# Patient Record
Sex: Male | Born: 1995 | Race: White | Hispanic: No | Marital: Single | Smoking: Never smoker
Health system: Southern US, Community
[De-identification: ages and names within clinical notes are randomized; demographics above are authoritative.]

## PROBLEM LIST (undated history)

## (undated) DIAGNOSIS — M064 Inflammatory polyarthropathy: Secondary | ICD-10-CM

## (undated) HISTORY — PX: WISDOM TOOTH EXTRACTION: SHX21

## (undated) HISTORY — DX: Inflammatory polyarthropathy: M06.4

## (undated) HISTORY — PX: COLONOSCOPY: SHX174

## (undated) HISTORY — PX: OTHER SURGICAL HISTORY: SHX169

---
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
## 2015-08-15 ENCOUNTER — Encounter (HOSPITAL_COMMUNITY): Payer: Self-pay | Admitting: Emergency Medicine

## 2015-08-15 ENCOUNTER — Emergency Department (HOSPITAL_COMMUNITY)
Admission: EM | Admit: 2015-08-15 | Discharge: 2015-08-16 | Disposition: A | Discharge status: Home / Self Care | Payer: 59 | Attending: Emergency Medicine | Admitting: Emergency Medicine

## 2015-08-15 DIAGNOSIS — M25511 Pain in right shoulder: Secondary | ICD-10-CM | POA: Diagnosis not present

## 2015-08-15 DIAGNOSIS — R079 Chest pain, unspecified: Secondary | ICD-10-CM | POA: Insufficient documentation

## 2015-08-15 DIAGNOSIS — M25512 Pain in left shoulder: Secondary | ICD-10-CM | POA: Insufficient documentation

## 2015-08-15 DIAGNOSIS — N390 Urinary tract infection, site not specified: Secondary | ICD-10-CM | POA: Insufficient documentation

## 2015-08-15 DIAGNOSIS — R509 Fever, unspecified: Secondary | ICD-10-CM | POA: Diagnosis present

## 2015-08-15 DIAGNOSIS — R05 Cough: Secondary | ICD-10-CM | POA: Insufficient documentation

## 2015-08-15 DIAGNOSIS — R634 Abnormal weight loss: Secondary | ICD-10-CM | POA: Diagnosis not present

## 2015-08-15 NOTE — ED Notes (Signed)
Pt states that he has had a 30lb weight loss in approx. 3 weeks but has a fever x 3 days. Pt also states that he has had aches and pains along with chest pain. Alert and oriented.

## 2015-08-15 NOTE — ED Notes (Signed)
Pt from home with his mother. Pt took motrin earlier for a temperature of 103 at home, which brought it down to 99 here. Pt has c/o generalized pain. Pt states he had similar symptoms about 5 years ago, along with a high temperature and low WBC count and they thought it might have been a tick bite.  Pt also states he has lost 30 lbs over about 3 months.

## 2015-08-16 ENCOUNTER — Emergency Department (HOSPITAL_COMMUNITY): Payer: 59

## 2015-08-16 LAB — COMPREHENSIVE METABOLIC PANEL
ALBUMIN: 3.7 g/dL (ref 3.5–5.0)
ALK PHOS: 78 U/L (ref 38–126)
ALT: 11 U/L — AB (ref 17–63)
AST: 14 U/L — ABNORMAL LOW (ref 15–41)
Anion gap: 6 (ref 5–15)
BUN: 19 mg/dL (ref 6–20)
CALCIUM: 8.8 mg/dL — AB (ref 8.9–10.3)
CO2: 26 mmol/L (ref 22–32)
CREATININE: 0.89 mg/dL (ref 0.61–1.24)
Chloride: 100 mmol/L — ABNORMAL LOW (ref 101–111)
GFR calc Af Amer: >60 mL/min (ref >60)
GFR calc non Af Amer: >60 mL/min (ref >60)
GLUCOSE: 100 mg/dL — AB (ref 65–99)
Potassium: 3.7 mmol/L (ref 3.5–5.1)
SODIUM: 132 mmol/L — AB (ref 135–145)
Total Bilirubin: 0.6 mg/dL (ref 0.3–1.2)
Total Protein: 7.5 g/dL (ref 6.5–8.1)

## 2015-08-16 LAB — URINALYSIS, ROUTINE W REFLEX MICROSCOPIC
BILIRUBIN URINE: NEGATIVE
GLUCOSE, UA: NEGATIVE mg/dL
HGB URINE DIPSTICK: NEGATIVE
KETONES UR: NEGATIVE mg/dL
Nitrite: NEGATIVE
PH: 7 (ref 5.0–8.0)
Protein, ur: NEGATIVE mg/dL
Specific Gravity, Urine: 1.017 (ref 1.005–1.030)

## 2015-08-16 LAB — CBC WITH DIFFERENTIAL/PLATELET
BASOS PCT: 0 %
Basophils Absolute: 0 10*3/uL (ref 0.0–0.1)
EOS ABS: 0 10*3/uL (ref 0.0–0.7)
Eosinophils Relative: 0 %
HEMATOCRIT: 30.5 % — AB (ref 39.0–52.0)
HEMOGLOBIN: 9.9 g/dL — AB (ref 13.0–17.0)
LYMPHS ABS: 1.8 10*3/uL (ref 0.7–4.0)
Lymphocytes Relative: 10 %
MCH: 27 pg (ref 26.0–34.0)
MCHC: 32.5 g/dL (ref 30.0–36.0)
MCV: 83.1 fL (ref 78.0–100.0)
Monocytes Absolute: 1.3 10*3/uL — ABNORMAL HIGH (ref 0.1–1.0)
Monocytes Relative: 7 %
NEUTROS ABS: 15.1 10*3/uL — AB (ref 1.7–7.7)
NEUTROS PCT: 83 %
Platelets: 493 10*3/uL — ABNORMAL HIGH (ref 150–400)
RBC: 3.67 MIL/uL — AB (ref 4.22–5.81)
RDW: 13.2 % (ref 11.5–15.5)
WBC: 18.2 10*3/uL — AB (ref 4.0–10.5)

## 2015-08-16 LAB — URINE MICROSCOPIC-ADD ON
BACTERIA UA: NONE SEEN
RBC / HPF: NONE SEEN RBC/hpf (ref 0–5)

## 2015-08-16 LAB — HIV ANTIBODY (ROUTINE TESTING W REFLEX): HIV Screen 4th Generation wRfx: NONREACTIVE

## 2015-08-16 MED ORDER — ACETAMINOPHEN 325 MG PO TABS
ORAL_TABLET | ORAL | Status: AC
  Filled 2015-08-16: qty 2

## 2015-08-16 MED ORDER — DOXYCYCLINE HYCLATE 100 MG PO CAPS: 100.0000 mg | ORAL_CAPSULE | Freq: Two times a day (BID) | ORAL | Status: DC

## 2015-08-16 MED ORDER — ACETAMINOPHEN 325 MG PO TABS
650.0000 mg | ORAL_TABLET | Freq: Once | ORAL | Status: AC
  Administered 2015-08-16: 650 mg via ORAL

## 2015-08-16 MED ORDER — CEPHALEXIN 500 MG PO CAPS: 500.0000 mg | ORAL_CAPSULE | Freq: Four times a day (QID) | ORAL | Status: DC

## 2015-08-16 MED ORDER — SODIUM CHLORIDE 0.9 % IV BOLUS (SEPSIS)
1000.0000 mL | Freq: Once | INTRAVENOUS | Status: AC
  Administered 2015-08-16: 1000 mL via INTRAVENOUS

## 2015-08-16 MED ORDER — DEXTROSE 5 % IV SOLN
INTRAVENOUS | Status: AC
  Filled 2015-08-16: qty 10

## 2015-08-16 MED ORDER — DEXTROSE 5 % IV SOLN
1.0000 g | Freq: Once | INTRAVENOUS | Status: AC
  Administered 2015-08-16: 1 g via INTRAVENOUS

## 2015-08-16 NOTE — ED Notes (Signed)
Pt is aware that urine is needed for sample, urinal at bedside. 

## 2015-08-16 NOTE — ED Provider Notes (Addendum)
CSN: [id number]     Arrival date & time 08/15/15  2312 History   First MD Initiated Contact with Patient 08/15/15 2341     Chief Complaint  Patient presents with  . Fever  . Weight Loss     (Consider location/radiation/quality/duration/timing/severity/associated sxs/prior Treatment) Patient is a 19 y.o. male presenting with fever. The history is provided by the patient. No language interpreter was used.  Fever Max temp prior to arrival:  103 Temp source:  Subjective and oral Severity:  Severe Onset quality:  Gradual Duration:  3 days Timing:  Constant Progression:  Worsening Chronicity:  New Relieved by:  Nothing Worsened by:  Nothing tried Ineffective treatments:  None tried Associated symptoms: cough   Associated symptoms: no diarrhea, no headaches, no nausea and no sore throat   Risk factors: no hx of cancer   Pt reports he has lost 30 pounds in the last 3 months.  Pt works as a Engineer, building services.  Pt does hard work but does not believe it is the cause of his weight loss. Mother is concerned about Rheumatoid arthritis because pt has bilat shoulder pain and joint pain. Pt complains of pain in his chest with a deep breath.  Pt denies std risk.  Pt had some difficulty urinating a few days ago. Pt reports his stream was split and he felt like he passed something and stream returned to normal. Pt denies HIv risk.  No drugs no alcohol.   History reviewed. No pertinent past medical history. Past Surgical History  Procedure Laterality Date  . Wisdom tooth extraction     No family history on file. Social History  Substance Use Topics  . Smoking status: Never Smoker   . Smokeless tobacco: None  . Alcohol Use: No    Review of Systems  Constitutional: Positive for fever.  HENT: Negative for sore throat.   Respiratory: Positive for cough.   Gastrointestinal: Negative for nausea and diarrhea.  Neurological: Negative for headaches.  All other systems reviewed and are  negative.     Allergies  Review of patient's allergies indicates no known allergies.  Home Medications   Prior to Admission medications   Medication Sig Start Date End Date Taking? Authorizing Provider  ibuprofen (ADVIL,MOTRIN) 200 MG tablet Take 400 mg by mouth every 6 (six) hours as needed for fever.   Yes Historical Provider, MD  Ibuprofen-Diphenhydramine HCl (ADVIL PM) 200-25 MG CAPS Take 1 tablet by mouth at bedtime as needed (sleep).   Yes Historical Provider, MD   BP 117/59 mmHg  Pulse 120  Temp(Src) 100.4 F (38 C) (Oral)  Resp 22  Wt 150 lb (68.04 kg)  SpO2 100% Physical Exam  Constitutional: He is oriented to person, place, and time. He appears well-developed and well-nourished.  HENT:  Head: Normocephalic.  Right Ear: External ear normal.  Left Ear: External ear normal.  Nose: Nose normal.  Mouth/Throat: Oropharynx is clear and moist.  Eyes: Conjunctivae and EOM are normal. Pupils are equal, round, and reactive to light.  Neck: Normal range of motion.  Cardiovascular: Normal rate and normal heart sounds.   Pulmonary/Chest: Effort normal.  Abdominal: Soft. He exhibits no distension.  Musculoskeletal: Normal range of motion.  Neurological: He is alert and oriented to person, place, and time.  Skin: Skin is warm.  Psychiatric: He has a normal mood and affect.  Nursing note and vitals reviewed.   ED Course  Procedures (including critical care time) Labs Review Labs Reviewed  CBC WITH DIFFERENTIAL/PLATELET  COMPREHENSIVE METABOLIC PANEL  URINALYSIS, ROUTINE W REFLEX MICROSCOPIC (NOT AT [facility])  HIV ANTIBODY (ROUTINE TESTING)    Imaging Review No results found. I have personally reviewed and evaluated these images and lab results as part of my medical decision-making.   EKG Interpretation None      MDM Iv fluids ordered, tylenol po.Pt's care turned over to Dr. Waverly Ferrari with labs pending at 1:00am.    Final diagnoses:  None        Fransico Meadow,  PA-C 08/16/15 [place], MD 08/16/15 [place], PA-C 08/16/15 [place], MD 08/16/15 [phone]

## 2015-08-17 ENCOUNTER — Ambulatory Visit (INDEPENDENT_AMBULATORY_CARE_PROVIDER_SITE_OTHER): Payer: PRIVATE HEALTH INSURANCE | Admitting: Osteopathic Medicine

## 2015-08-17 ENCOUNTER — Encounter: Payer: Self-pay | Admitting: Osteopathic Medicine

## 2015-08-17 VITALS — BP 129/67 | HR 98 | Ht 77.0 in | Wt 154.0 lb

## 2015-08-17 DIAGNOSIS — R109 Unspecified abdominal pain: Secondary | ICD-10-CM

## 2015-08-17 DIAGNOSIS — R634 Abnormal weight loss: Secondary | ICD-10-CM | POA: Diagnosis not present

## 2015-08-17 DIAGNOSIS — N390 Urinary tract infection, site not specified: Secondary | ICD-10-CM | POA: Diagnosis not present

## 2015-08-17 DIAGNOSIS — D72829 Elevated white blood cell count, unspecified: Secondary | ICD-10-CM

## 2015-08-17 DIAGNOSIS — A689 Relapsing fever, unspecified: Secondary | ICD-10-CM | POA: Diagnosis not present

## 2015-08-17 DIAGNOSIS — M2550 Pain in unspecified joint: Secondary | ICD-10-CM

## 2015-08-17 DIAGNOSIS — D649 Anemia, unspecified: Secondary | ICD-10-CM

## 2015-08-17 LAB — FERRITIN: Ferritin: 972 ng/mL — ABNORMAL HIGH (ref 22–322)

## 2015-08-17 LAB — COMPLETE METABOLIC PANEL WITH GFR
ALBUMIN: 3.9 g/dL (ref 3.6–5.1)
ALK PHOS: 93 U/L (ref 40–115)
ALT: 8 U/L — AB (ref 9–46)
AST: 14 U/L (ref 10–40)
BILIRUBIN TOTAL: 0.5 mg/dL (ref 0.2–1.2)
BUN: 11 mg/dL (ref 7–25)
CALCIUM: 9.5 mg/dL (ref 8.6–10.3)
CO2: 26 mmol/L (ref 20–31)
CREATININE: 0.64 mg/dL (ref 0.60–1.35)
Chloride: 98 mmol/L (ref 98–110)
GFR, Est African American: >89 mL/min (ref >=60)
GFR, Est Non African American: >89 mL/min (ref >=60)
Glucose, Bld: 61 mg/dL — ABNORMAL LOW (ref 65–99)
Potassium: 4 mmol/L (ref 3.5–5.3)
Sodium: 137 mmol/L (ref 135–146)
TOTAL PROTEIN: 7.7 g/dL (ref 6.1–8.1)

## 2015-08-17 LAB — POCT URINALYSIS DIPSTICK
BILIRUBIN UA: NEGATIVE
GLUCOSE UA: NEGATIVE
Ketones, UA: NEGATIVE
Leukocytes, UA: NEGATIVE
NITRITE UA: NEGATIVE
RBC UA: NEGATIVE
Spec Grav, UA: >=1.03
Urobilinogen, UA: 0.2
pH, UA: 5.5

## 2015-08-17 LAB — CBC WITH DIFFERENTIAL/PLATELET
BASOS ABS: 0 10*3/uL (ref 0.0–0.1)
Basophils Relative: 0 % (ref 0–1)
EOS PCT: 0 % (ref 0–5)
Eosinophils Absolute: 0 10*3/uL (ref 0.0–0.7)
HEMATOCRIT: 33.3 % — AB (ref 39.0–52.0)
Hemoglobin: 10.8 g/dL — ABNORMAL LOW (ref 13.0–17.0)
LYMPHS ABS: 1.3 10*3/uL (ref 0.7–4.0)
LYMPHS PCT: 7 % — AB (ref 12–46)
MCH: 26.3 pg (ref 26.0–34.0)
MCHC: 32.4 g/dL (ref 30.0–36.0)
MCV: 81 fL (ref 78.0–100.0)
MPV: 9.1 fL (ref 8.6–12.4)
Monocytes Absolute: 1.1 10*3/uL — ABNORMAL HIGH (ref 0.1–1.0)
Monocytes Relative: 6 % (ref 3–12)
Neutro Abs: 15.9 10*3/uL — ABNORMAL HIGH (ref 1.7–7.7)
Neutrophils Relative %: 87 % — ABNORMAL HIGH (ref 43–77)
Platelets: 564 10*3/uL — ABNORMAL HIGH (ref 150–400)
RBC: 4.11 MIL/uL — ABNORMAL LOW (ref 4.22–5.81)
RDW: 13.5 % (ref 11.5–15.5)
WBC: 18.3 10*3/uL — ABNORMAL HIGH (ref 4.0–10.5)

## 2015-08-17 LAB — RETICULOCYTES
ABS Retic: 45.2 10*3/uL (ref 19.0–186.0)
RBC.: 4.11 MIL/uL — ABNORMAL LOW (ref 4.22–5.81)
Retic Ct Pct: 1.1 % (ref 0.4–2.3)

## 2015-08-17 LAB — URINE CULTURE: CULTURE: NO GROWTH

## 2015-08-17 LAB — IRON AND TIBC
%SAT: 5 % — AB (ref 15–60)
IRON: 12 ug/dL — AB (ref 50–195)
TIBC: 255 ug/dL (ref 250–425)
UIBC: 243 ug/dL (ref 125–400)

## 2015-08-17 LAB — TSH: TSH: 1.466 u[IU]/mL (ref 0.350–4.500)

## 2015-08-17 LAB — RHEUMATOID FACTOR: Rhuematoid fact SerPl-aCnc: <10 IU/mL (ref <=14)

## 2015-08-17 LAB — C-REACTIVE PROTEIN: CRP: 10.2 mg/dL — AB (ref <0.60)

## 2015-08-17 MED ORDER — MELOXICAM 7.5 MG PO TABS: 7.5000 mg | ORAL_TABLET | Freq: Two times a day (BID) | ORAL | Status: DC | PRN

## 2015-08-17 NOTE — Progress Notes (Signed)
HPI: Seth Black is a 19 y.o. male who presents to [place]  today for chief complaint of:  Chief Complaint  Patient presents with  . Establish Care  . Hospitalization Follow-up    POSSIBLE UTI  . Fever  . Weight Loss    35 lbs x 2 mos   Accompanied by mother, patient gives permission to speak freely in front of her  Lost 35 lbs in about 2 months. Joint soreness about 3 months. Urinary issues started about 2 weeks ago. Fever at home before ER was 103. Subjective fever on and off over last few weeks/months, but he cannot measure this.   Seen in [facility] ER yesterday, diagnosed with UTI. Got Ceftriaxone IM in ER plus 10 days Doxy from ER - presumably empiric treatment for GC/CT. Keflex qid also given, presumably for coverage for UTI as well. Culture pending.   Before that, last seen at [facility] in [place], again with concerns for malaise, fever, weight. He was told blood work was ok. Before that, seen at [facility] ER and told inflammatory markers high but not sure what tests these were.   No significant PMH or FH. Patient takes no medications.  Labs reviewed as below: concern for anemia and UTI.    Past medical, social and family history reviewed: History reviewed. No pertinent past medical history. Past Surgical History  Procedure Laterality Date  . Wisdom tooth extraction     Social History  Substance Use Topics  . Smoking status: Never Smoker   . Smokeless tobacco: Not on file  . Alcohol Use: No   No family history on file.  Current Outpatient Prescriptions  Medication Sig Dispense Refill  . cephALEXin (KEFLEX) 500 MG capsule Take 1 capsule (500 mg total) by mouth 4 (four) times daily. 40 capsule 0  . doxycycline (VIBRAMYCIN) 100 MG capsule Take 1 capsule (100 mg total) by mouth 2 (two) times daily. 20 capsule 0  . ibuprofen (ADVIL,MOTRIN) 200 MG tablet Take 400 mg by mouth every 6 (six) hours as needed for fever.    .  Ibuprofen-Diphenhydramine HCl (ADVIL PM) 200-25 MG CAPS Take 1 tablet by mouth at bedtime as needed (sleep).     No current facility-administered medications for this visit.   No Known Allergies    Review of Systems: CONSTITUTIONAL:  Yes  fever, no chills, Yes  unintentional weight changes reports weight loss 35 lbs x 2 mos HEAD/EYES/EARS/NOSE/THROAT: No headache, no vision change, no hearing change, No  sore throat CARDIAC: Yes occasional chest pain hurts to breathe, no pressure/palpitations, no orthopnea RESPIRATORY: No  cough, No  shortness of breath/wheeze GASTROINTESTINAL: No nausea, no vomiting, positive vague lower abdominal pain, no blood in stool, no diarrhea, no constipation MUSCULOSKELETAL: (+) myalgia/arthralgia, reports generalized aches and pains, joint pain particularly children GENITOURINARY: No incontinence, No abnormal genital bleeding/discharge, recently treated in the emergency room for UTI, urinary frequency and burning, patient was reporting some suprapubic pain this has been better, overall improved  SKIN: No rash/wounds/concerning lesions HEM/ONC: No easy bruising/bleeding, no abnormal lymph node ENDOCRINE: No polyuria/polydipsia/polyphagia, no heat/cold intolerance  NEUROLOGIC: (+) Generalized weakness, no dizziness, no slurred speech PSYCHIATRIC: No concerns with depression, no concerns with anxiety, no sleep problems    Exam:  BP 129/67 mmHg  Pulse 98  Ht $R'6\' 5"'uj$  (1.956 m)  Wt 154 lb (69.854 kg)  BMI 18.26 kg/m2 Constitutional: VSS, see above. General Appearance: alert, well-developed, well-nourished, NAD Eyes: Normal lids and conjunctive, non-icteric  sclera, PERRLA Ears, Nose, Mouth, Throat: Normal external inspection ears/nares/mouth/lips/gums, TM normal bilaterally, MMM, posterior pharynx No  erythema No  exudate Neck: No masses, trachea midline. No thyroid enlargement/tenderness/mass appreciated. No lymphadenopathy Respiratory: Normal respiratory effort.  no wheeze, no rhonchi, no rales Cardiovascular: S1/S2 normal, no murmur, no rub/gallop auscultated. RRR.  No carotid bruit or JVD. No abdominal aortic bruit.  Pedal pulse II/IV bilaterally DP and PT.  No lower extremity edema. Gastrointestinal: Nontender, no masses. No hepatomegaly, no splenomegaly. No hernia appreciated. Bowel sounds normal. Rectal exam deferred.  Musculoskeletal: Gait normal. No clubbing/cyanosis of digits.  Neurological: No cranial nerve deficit on limited exam. Motor and sensation intact and symmetric Psychiatric: Normal judgment/insight. Normal mood and affect. Oriented x3.    Results for orders placed or performed during the hospital encounter of 08/15/15 (from the past 72 hour(s))  CBC with Differential/Platelet     Status: Abnormal   Collection Time: 08/16/15  1:03 AM  Result Value Ref Range   WBC 18.2 (H) 4.0 - 10.5 K/uL   RBC 3.67 (L) 4.22 - 5.81 MIL/uL   Hemoglobin 9.9 (L) 13.0 - 17.0 g/dL   HCT 30.5 (L) 39.0 - 52.0 %   MCV 83.1 78.0 - 100.0 fL   MCH 27.0 26.0 - 34.0 pg   MCHC 32.5 30.0 - 36.0 g/dL   RDW 13.2 11.5 - 15.5 %   Platelets 493 (H) 150 - 400 K/uL   Neutrophils Relative % 83 %   Neutro Abs 15.1 (H) 1.7 - 7.7 K/uL   Lymphocytes Relative 10 %   Lymphs Abs 1.8 0.7 - 4.0 K/uL   Monocytes Relative 7 %   Monocytes Absolute 1.3 (H) 0.1 - 1.0 K/uL   Eosinophils Relative 0 %   Eosinophils Absolute 0.0 0.0 - 0.7 K/uL   Basophils Relative 0 %   Basophils Absolute 0.0 0.0 - 0.1 K/uL  Comprehensive metabolic panel     Status: Abnormal   Collection Time: 08/16/15  1:03 AM  Result Value Ref Range   Sodium 132 (L) 135 - 145 mmol/L   Potassium 3.7 3.5 - 5.1 mmol/L   Chloride 100 (L) 101 - 111 mmol/L   CO2 26 22 - 32 mmol/L   Glucose, Bld 100 (H) 65 - 99 mg/dL   BUN 19 6 - 20 mg/dL   Creatinine, Ser 0.89 0.61 - 1.24 mg/dL   Calcium 8.8 (L) 8.9 - 10.3 mg/dL   Total Protein 7.5 6.5 - 8.1 g/dL   Albumin 3.7 3.5 - 5.0 g/dL   AST 14 (L) 15 - 41 U/L   ALT  11 (L) 17 - 63 U/L   Alkaline Phosphatase 78 38 - 126 U/L   Total Bilirubin 0.6 0.3 - 1.2 mg/dL   GFR calc non Af Amer >60 >60 mL/min   GFR calc Af Amer >60 >60 mL/min    Comment: (NOTE) The eGFR has been calculated using the CKD EPI equation. This calculation has not been validated in all clinical situations. eGFR's persistently <60 mL/min signify possible Chronic Kidney Disease.    Anion gap 6 5 - 15  HIV antibody     Status: None   Collection Time: 08/16/15  1:03 AM  Result Value Ref Range   HIV Screen 4th Generation wRfx Non Reactive Non Reactive    Comment: (NOTE) Performed At: [facility] [address] [place], [place] [id number] Lindon Romp MD [id number]   Urinalysis, Routine w reflex microscopic (not at [facility])  Status: Abnormal   Collection Time: 08/16/15  1:30 AM  Result Value Ref Range   Color, Urine YELLOW YELLOW   APPearance CLEAR CLEAR   Specific Gravity, Urine 1.017 1.005 - 1.030   pH 7.0 5.0 - 8.0   Glucose, UA NEGATIVE NEGATIVE mg/dL   Hgb urine dipstick NEGATIVE NEGATIVE   Bilirubin Urine NEGATIVE NEGATIVE   Ketones, ur NEGATIVE NEGATIVE mg/dL   Protein, ur NEGATIVE NEGATIVE mg/dL   Nitrite NEGATIVE NEGATIVE   Leukocytes, UA MODERATE (A) NEGATIVE  Urine microscopic-add on     Status: Abnormal   Collection Time: 08/16/15  1:30 AM  Result Value Ref Range   Squamous Epithelial / LPF 0-5 (A) NONE SEEN    Comment: Please note change in reference range.   WBC, UA TOO NUMEROUS TO COUNT 0 - 5 WBC/hpf    Comment: Please note change in reference range.   RBC / HPF NONE SEEN 0 - 5 RBC/hpf    Comment: Please note change in reference range.   Bacteria, UA NONE SEEN NONE SEEN    Comment: Please note change in reference range.   CXR read as normal in ER   ASSESSMENT/PLAN:  Unintentional weight loss - Plan: CBC with Differential/Platelet, TSH, COMPLETE METABOLIC PANEL WITH GFR, C-reactive protein, Antinuclear Antib (ANA), Rheumatoid Factor,  Sedimentation rate, High sensitivity CRP, VITAMIN D 25 Hydroxy (Vit-D Deficiency, Fractures), AFB culture, blood, DG Chest 2 View, TB Skin Test, Pathologist smear review  Abdominal pain, unspecified abdominal location - Plan: POCT Urinalysis Dipstick  Recurrent fever - Plan: AFB culture, blood, DG Chest 2 View, TB Skin Test, Pathologist smear review, AFB culture, blood  Urinary tract infection without hematuria, site unspecified  Anemia, unspecified anemia type - Plan: CBC with Differential/Platelet, Ferritin, Folate RBC, Iron and TIBC, Reticulocytes, Pathologist smear review, CANCELED: Save smear  Joint pain - Plan: meloxicam (MOBIC) 7.5 MG tablet   Leukocytosis: Most likely due to UTI, will follow CBC  Patient and his mother advised that given his symptoms of intermittent fever and significant weight loss over the past few months, many different causes for these symptoms are possible. Basic lab workup as above. Need to look further into cause of significant anemia. Patient denied easy bruising or bleeding, no blood or dark black stool. No history of kidney or other urinary abnormalities which may need to urinary tract infection. Patient denies history of section transmitted infection. Gonorrhea and Chlamydia tests were ordered in the ER however results are not available, not sure if this order was carried out. We'll need to contact the lab to see if this can be added onto his original urine culture results from his initial specimen. Since patient already on antibiotics, wouldn't consider repeat culture reliable at this time. Patient agrees to come back next week for further discussion of results, we'll call sooner if any critical values come back sooner than that, otherwise prefer to discuss all results in detail. Patient and his mother amenable to this plan. All questions answered, patient is mother are instructed to call office with any other questions or concerns, I am on call this weekend.  Patient also has eczema urologist which she was given in the ER, I advised him to call and make an appointment for further workup of consultative UTI in young male, though there is certainly consideration for underlying serious infection or other cause immunocompromise, further workup pending lab results   Return in about 1 week (around 08/24/2015), or sooner if symptoms worsen or fail to  improve, for DISCUSS RESULTS.  Total time spent 45 minutes, greater than 50% of the visit was counseling and coordinating care for diagnosis of weight loss and fever, UTI, anemia, weakness.

## 2015-08-18 ENCOUNTER — Telehealth: Payer: Self-pay | Admitting: Emergency Medicine

## 2015-08-18 DIAGNOSIS — D72829 Elevated white blood cell count, unspecified: Secondary | ICD-10-CM | POA: Insufficient documentation

## 2015-08-18 DIAGNOSIS — D638 Anemia in other chronic diseases classified elsewhere: Secondary | ICD-10-CM | POA: Insufficient documentation

## 2015-08-18 DIAGNOSIS — R634 Abnormal weight loss: Secondary | ICD-10-CM | POA: Insufficient documentation

## 2015-08-18 DIAGNOSIS — A689 Relapsing fever, unspecified: Secondary | ICD-10-CM | POA: Insufficient documentation

## 2015-08-18 DIAGNOSIS — R109 Unspecified abdominal pain: Secondary | ICD-10-CM | POA: Insufficient documentation

## 2015-08-18 DIAGNOSIS — M2550 Pain in unspecified joint: Secondary | ICD-10-CM | POA: Insufficient documentation

## 2015-08-18 DIAGNOSIS — N390 Urinary tract infection, site not specified: Secondary | ICD-10-CM | POA: Insufficient documentation

## 2015-08-18 LAB — VITAMIN D 25 HYDROXY (VIT D DEFICIENCY, FRACTURES): Vit D, 25-Hydroxy: 28 ng/mL — ABNORMAL LOW (ref 30–100)

## 2015-08-18 LAB — HIGH SENSITIVITY CRP: CRP, High Sensitivity: 102 mg/L — ABNORMAL HIGH

## 2015-08-18 LAB — ANA: Anti Nuclear Antibody(ANA): POSITIVE — AB

## 2015-08-18 LAB — FOLATE RBC

## 2015-08-18 LAB — SEDIMENTATION RATE: Sed Rate: 63 mm/hr — ABNORMAL HIGH (ref 0–15)

## 2015-08-18 LAB — ANTI-NUCLEAR AB-TITER (ANA TITER): ANA Titer 1: 1:160 {titer} — ABNORMAL HIGH

## 2015-08-18 LAB — PATHOLOGIST SMEAR REVIEW

## 2015-08-19 ENCOUNTER — Encounter (HOSPITAL_COMMUNITY): Payer: Self-pay | Admitting: *Deleted

## 2015-08-19 ENCOUNTER — Emergency Department (HOSPITAL_COMMUNITY): Payer: 59

## 2015-08-19 ENCOUNTER — Emergency Department (HOSPITAL_COMMUNITY)
Admission: EM | Admit: 2015-08-19 | Discharge: 2015-08-19 | Disposition: A | Discharge status: Home / Self Care | Payer: 59 | Attending: Emergency Medicine | Admitting: Emergency Medicine

## 2015-08-19 DIAGNOSIS — M2550 Pain in unspecified joint: Secondary | ICD-10-CM

## 2015-08-19 DIAGNOSIS — Z792 Long term (current) use of antibiotics: Secondary | ICD-10-CM | POA: Insufficient documentation

## 2015-08-19 DIAGNOSIS — A689 Relapsing fever, unspecified: Secondary | ICD-10-CM

## 2015-08-19 DIAGNOSIS — R509 Fever, unspecified: Secondary | ICD-10-CM | POA: Diagnosis not present

## 2015-08-19 DIAGNOSIS — D72829 Elevated white blood cell count, unspecified: Secondary | ICD-10-CM | POA: Insufficient documentation

## 2015-08-19 DIAGNOSIS — R634 Abnormal weight loss: Secondary | ICD-10-CM | POA: Diagnosis not present

## 2015-08-19 LAB — COMPREHENSIVE METABOLIC PANEL
ALT: 11 U/L — AB (ref 17–63)
ANION GAP: 12 (ref 5–15)
AST: 16 U/L (ref 15–41)
Albumin: 3.7 g/dL (ref 3.5–5.0)
Alkaline Phosphatase: 89 U/L (ref 38–126)
BUN: 12 mg/dL (ref 6–20)
CHLORIDE: 100 mmol/L — AB (ref 101–111)
CO2: 24 mmol/L (ref 22–32)
CREATININE: 0.8 mg/dL (ref 0.61–1.24)
Calcium: 9.4 mg/dL (ref 8.9–10.3)
Glucose, Bld: 96 mg/dL (ref 65–99)
Potassium: 3.4 mmol/L — ABNORMAL LOW (ref 3.5–5.1)
Sodium: 136 mmol/L (ref 135–145)
Total Bilirubin: 0.4 mg/dL (ref 0.3–1.2)
Total Protein: 8.4 g/dL — ABNORMAL HIGH (ref 6.5–8.1)

## 2015-08-19 LAB — URINALYSIS, ROUTINE W REFLEX MICROSCOPIC
Bilirubin Urine: NEGATIVE
GLUCOSE, UA: NEGATIVE mg/dL
HGB URINE DIPSTICK: NEGATIVE
KETONES UR: NEGATIVE mg/dL
Nitrite: NEGATIVE
PH: 6 (ref 5.0–8.0)
Protein, ur: NEGATIVE mg/dL
Specific Gravity, Urine: 1.021 (ref 1.005–1.030)

## 2015-08-19 LAB — CBC WITH DIFFERENTIAL/PLATELET
BASOS PCT: 0 %
Basophils Absolute: 0.1 10*3/uL (ref 0.0–0.1)
EOS ABS: 0.1 10*3/uL (ref 0.0–0.7)
Eosinophils Relative: 1 %
HEMATOCRIT: 32.3 % — AB (ref 39.0–52.0)
HEMOGLOBIN: 10.5 g/dL — AB (ref 13.0–17.0)
LYMPHS ABS: 1.9 10*3/uL (ref 0.7–4.0)
Lymphocytes Relative: 16 %
MCH: 26.9 pg (ref 26.0–34.0)
MCHC: 32.5 g/dL (ref 30.0–36.0)
MCV: 82.6 fL (ref 78.0–100.0)
Monocytes Absolute: 1.3 10*3/uL — ABNORMAL HIGH (ref 0.1–1.0)
Monocytes Relative: 11 %
NEUTROS ABS: 8.6 10*3/uL — AB (ref 1.7–7.7)
NEUTROS PCT: 72 %
Platelets: 567 10*3/uL — ABNORMAL HIGH (ref 150–400)
RBC: 3.91 MIL/uL — AB (ref 4.22–5.81)
RDW: 13.1 % (ref 11.5–15.5)
WBC: 11.9 10*3/uL — AB (ref 4.0–10.5)

## 2015-08-19 LAB — INFLUENZA PANEL BY PCR (TYPE A & B)
H1N1FLUPCR: NOT DETECTED
INFLBPCR: NEGATIVE
Influenza A By PCR: NEGATIVE

## 2015-08-19 LAB — URINE MICROSCOPIC-ADD ON

## 2015-08-19 LAB — I-STAT CG4 LACTIC ACID, ED: LACTIC ACID, VENOUS: 0.89 mmol/L (ref 0.5–2.0)

## 2015-08-19 LAB — RPR: RPR Ser Ql: NONREACTIVE

## 2015-08-19 MED ORDER — SODIUM CHLORIDE 0.9 % IV SOLN
1000.0000 mL | INTRAVENOUS | Status: DC
  Administered 2015-08-19 (×2): 1000 mL via INTRAVENOUS

## 2015-08-19 NOTE — Discharge Instructions (Signed)
1. Medications: usual home medications - including your Keflex and Doxycycline 2. Treatment: rest, drink plenty of fluids, Tylenol or ibuprofen -for fever control 3. Follow Up: Please followup with your primary doctor at your appointment on Wednesday for discussion of your diagnoses and further evaluation after today's visit; if you do not have a primary care doctor use the resource guide provided to find one; Please return to the ER for worsening symptoms or other concerns

## 2015-08-19 NOTE — ED Notes (Signed)
Patient transported to X-ray 

## 2015-08-19 NOTE — ED Provider Notes (Signed)
CSN: [id number]     Arrival date & time 08/19/15  0022 History   First MD Initiated Contact with Patient 08/19/15 0128     Chief Complaint  Patient presents with  . Fever     (Consider location/radiation/quality/duration/timing/severity/associated sxs/prior Treatment) Patient is a 19 y.o. male presenting with fever. The history is provided by the patient, a parent and medical records. No language interpreter was used.  Fever Associated symptoms: no chest pain, no cough, no diarrhea, no dysuria, no headaches, no nausea, no rash and no vomiting      Seth Black is a 19 y.o. male  with no major medical history presents to the Emergency Department complaining of gradual, persistent, fevers onset 3 days ago. Patient's mother is at bedside and reports that in the last 3 months patient has lost approximately 40 pain returns she reports that he has migrating myalgias most prominently in his hips, shoulders and back. Patient denies swelling, erythema or difficulty bending his joints.  Patient reports that 3 days ago he was found to have a fever of 103.84F and presented here to the emergency department. Record review shows that he had pyuria and was treated for UTI with Keflex and doxycycline. Gonorrhea and Chlamydia cultures were sent but have not resulted.  Patient followed up with a primary care provider yesterday and an entire battery of test was sent. Of note patient's CRP, ESR and a in a were positive. His HIV and urine culture were negative.   Blood cultures were obtained and sent yesterday.   Patient reports he is here in the emergency room tonight because he again was febrile at home.   He has taken ibuprofen with moderate relief of this.   Patient reports that earlier in the week he did have dysuria however he denies dysuria at this time. He denies headache, neck pain, chest pain, shortness of breath, abdominal pain, nausea, vomiting, diarrhea, weakness, dizziness, syncope, hematuria.    Ibuprofen seems to make his symptoms better. Significant strain on his joints make his pain worse. Patient denies travel outside the [place], known tick bites, genital lesions.   History reviewed. No pertinent past medical history. Past Surgical History  Procedure Laterality Date  . Wisdom tooth extraction     No family history on file. Social History  Substance Use Topics  . Smoking status: Never Smoker   . Smokeless tobacco: None  . Alcohol Use: No    Review of Systems  Constitutional: Positive for fever. Negative for diaphoresis, appetite change, fatigue and unexpected weight change.  HENT: Negative for mouth sores.   Eyes: Negative for visual disturbance.  Respiratory: Negative for cough, chest tightness, shortness of breath and wheezing.   Cardiovascular: Negative for chest pain.  Gastrointestinal: Negative for nausea, vomiting, abdominal pain, diarrhea and constipation.  Endocrine: Negative for polydipsia, polyphagia and polyuria.  Genitourinary: Negative for dysuria, urgency, frequency and hematuria.  Musculoskeletal: Positive for arthralgias ( diffuse). Negative for back pain and neck stiffness.  Skin: Negative for rash.  Allergic/Immunologic: Negative for immunocompromised state.  Neurological: Negative for syncope, light-headedness and headaches.  Hematological: Does not bruise/bleed easily.  Psychiatric/Behavioral: Negative for sleep disturbance. The patient is not nervous/anxious.       Allergies  Review of patient's allergies indicates no known allergies.  Home Medications   Prior to Admission medications   Medication Sig Start Date End Date Taking? Authorizing Provider  cephALEXin (KEFLEX) 500 MG capsule Take 1 capsule (500 mg total) by mouth 4 (four) times  daily. 08/16/15  Yes Orpah Greek, MD  doxycycline (VIBRAMYCIN) 100 MG capsule Take 1 capsule (100 mg total) by mouth 2 (two) times daily. 08/16/15  Yes Orpah Greek, MD  ibuprofen  (ADVIL,MOTRIN) 200 MG tablet Take 800 mg by mouth every 6 (six) hours as needed for fever.    Yes Historical Provider, MD  Ibuprofen-Diphenhydramine HCl (ADVIL PM) 200-25 MG CAPS Take 1 tablet by mouth at bedtime as needed (sleep).   Yes Historical Provider, MD  meloxicam (MOBIC) 7.5 MG tablet Take 1 tablet (7.5 mg total) by mouth 2 (two) times daily as needed for pain. 08/17/15  Yes Natalie Alexander, DO   BP 119/68 mmHg  Pulse 87  Temp(Src) 100.1 F (37.8 C) (Rectal)  Resp 14  Ht 6' 5"  (1.956 m)  Wt 148 lb (67.132 kg)  BMI 17.55 kg/m2  SpO2 100% Physical Exam  Constitutional: He appears well-developed and well-nourished. No distress.  Awake, alert, nontoxic appearance  HENT:  Head: Normocephalic and atraumatic.  Mouth/Throat: Oropharynx is clear and moist. No oropharyngeal exudate.  Eyes: Conjunctivae are normal. No scleral icterus.  Neck: Normal range of motion. Neck supple.  Cardiovascular: Normal rate, regular rhythm, normal heart sounds and intact distal pulses.   No murmur heard. No tachycardia Capillary refill less than 3 seconds in all extremities  Pulmonary/Chest: Effort normal and breath sounds normal. No respiratory distress. He has no wheezes.  Equal chest expansion Clear and equal breath sounds  Abdominal: Soft. Bowel sounds are normal. He exhibits no mass. There is no tenderness. There is no rebound and no guarding.   Soft and nontender  Musculoskeletal: Normal range of motion. He exhibits no edema.  Full range of motion of all major joints without erythema, induration, increased warmth  Neurological: He is alert.  Speech is clear and goal oriented Moves extremities without ataxia  Skin: Skin is warm and dry. He is not diaphoretic.  No rash.  Psychiatric: He has a normal mood and affect.  Nursing note and vitals reviewed.   ED Course  Procedures (including critical care time) Labs Review Labs Reviewed  COMPREHENSIVE METABOLIC PANEL - Abnormal; Notable for  the following:    Potassium 3.4 (*)    Chloride 100 (*)    Total Protein 8.4 (*)    ALT 11 (*)    All other components within normal limits  CBC WITH DIFFERENTIAL/PLATELET - Abnormal; Notable for the following:    WBC 11.9 (*)    RBC 3.91 (*)    Hemoglobin 10.5 (*)    HCT 32.3 (*)    Platelets 567 (*)    Neutro Abs 8.6 (*)    Monocytes Absolute 1.3 (*)    All other components within normal limits  URINALYSIS, ROUTINE W REFLEX MICROSCOPIC (NOT AT [facility]) - Abnormal; Notable for the following:    Leukocytes, UA SMALL (*)    All other components within normal limits  URINE MICROSCOPIC-ADD ON - Abnormal; Notable for the following:    Squamous Epithelial / LPF 0-5 (*)    Bacteria, UA RARE (*)    All other components within normal limits  CULTURE, BLOOD (ROUTINE X 2)  URINE CULTURE  RPR  INFLUENZA PANEL BY PCR (TYPE A & B, H1N1)  I-STAT CG4 LACTIC ACID, ED    Imaging Review Dg Chest 2 View  08/19/2015  CLINICAL DATA:  Persistent fever despite antibiotic therapy for urinary tract infection. EXAM: CHEST  2 VIEW COMPARISON:  08/16/2015 FINDINGS: The heart size and mediastinal  contours are within normal limits. Both lungs are clear. The visualized skeletal structures are unremarkable. IMPRESSION: No active cardiopulmonary disease. Electronically Signed   By: Earle Gell M.D.   On: 08/19/2015 02:49   I have personally reviewed and evaluated these images and lab results as part of my medical decision-making.   EKG Interpretation None      MDM   Final diagnoses:  Fever, unspecified fever cause  Unintentional weight loss  Recurrent fever  Joint pain  Leukocytosis   Seth Black presents with persistent fevers in the setting of significant weight loss and polyarthralgia. Significant work up has been undertaken by his primary care without diagnosis.  Labs are improving today with a significant improvement in his white blood cell count. Stable anemia. Patient's urine appears to be  improving. Influenza and RPR are pending. Urine culture is pending.  Gonorrhea and Chlamydia cultures ordered on 08/16/2015 continued to pend. The lab has been contacted about this.  Patient with persistent low-grade fever here in the emergency department but no evidence of sepsis. Normal lactic acid. Patient tolerating by mouth without difficulty. Recommend close follow-up with his primary care physician  The patient was discussed with and seen by Dr. Leonides Schanz who agrees with the treatment plan.  BP 119/68 mmHg  Pulse 87  Temp(Src) 100.1 F (37.8 C) (Rectal)  Resp 14  Ht 6' 5"  (1.956 m)  Wt 148 lb (67.132 kg)  BMI 17.55 kg/m2  SpO2 100%     Abigail Butts, PA-C 08/19/15 [place], DO 08/19/15 [id number]

## 2015-08-19 NOTE — ED Notes (Signed)
Pt states that he was seen by his PCP and here this week; pt was diagnosed with a UTI and is on multiple medications; mom is concerned that pt continues to run a fever despite being on antibiotics since Tuesday and mom reports fever of 103.7 at home prior to arrival; mom states that she treats the fever with tylenol and ibuprofen but that it returns

## 2015-08-19 NOTE — ED Notes (Signed)
Per PA, hold off on second set of blood cultures.

## 2015-08-20 LAB — URINE CULTURE: CULTURE: NO GROWTH

## 2015-08-21 ENCOUNTER — Ambulatory Visit: Payer: PRIVATE HEALTH INSURANCE | Admitting: Osteopathic Medicine

## 2015-08-21 NOTE — Telephone Encounter (Signed)
Thanks. I purposefully asked them to make an appointment to go over labs since there were so many results to review, let them know I'd call over the weekend if anything critical came back.

## 2015-08-22 ENCOUNTER — Other Ambulatory Visit: Payer: Self-pay | Admitting: Osteopathic Medicine

## 2015-08-22 DIAGNOSIS — R509 Fever, unspecified: Secondary | ICD-10-CM

## 2015-08-22 LAB — RPR: RPR Ser Ql: NONREACTIVE

## 2015-08-23 ENCOUNTER — Ambulatory Visit (INDEPENDENT_AMBULATORY_CARE_PROVIDER_SITE_OTHER): Payer: PRIVATE HEALTH INSURANCE | Admitting: Osteopathic Medicine

## 2015-08-23 ENCOUNTER — Encounter: Payer: Self-pay | Admitting: Osteopathic Medicine

## 2015-08-23 VITALS — BP 137/67 | HR 87 | Wt 160.0 lb

## 2015-08-23 DIAGNOSIS — M2550 Pain in unspecified joint: Secondary | ICD-10-CM

## 2015-08-23 DIAGNOSIS — A689 Relapsing fever, unspecified: Secondary | ICD-10-CM

## 2015-08-23 DIAGNOSIS — R634 Abnormal weight loss: Secondary | ICD-10-CM

## 2015-08-23 DIAGNOSIS — D75838 Other thrombocytosis: Secondary | ICD-10-CM

## 2015-08-23 DIAGNOSIS — R768 Other specified abnormal immunological findings in serum: Secondary | ICD-10-CM

## 2015-08-23 DIAGNOSIS — R7989 Other specified abnormal findings of blood chemistry: Secondary | ICD-10-CM | POA: Insufficient documentation

## 2015-08-23 DIAGNOSIS — D72829 Elevated white blood cell count, unspecified: Secondary | ICD-10-CM

## 2015-08-23 DIAGNOSIS — N390 Urinary tract infection, site not specified: Secondary | ICD-10-CM

## 2015-08-23 DIAGNOSIS — R8290 Unspecified abnormal findings in urine: Secondary | ICD-10-CM | POA: Insufficient documentation

## 2015-08-23 DIAGNOSIS — R8281 Pyuria: Secondary | ICD-10-CM | POA: Insufficient documentation

## 2015-08-23 NOTE — Progress Notes (Signed)
HPI: Seth Black is a 19 y.o. male who presents to [facility]  today for chief complaint of:  Chief Complaint  Patient presents with  . Results   Patient and mother are here to discuss results of workup for recurrent fever and weight loss. Results are reviewed in detail. Consent results include leukocytosis, which is resolving. Anemia which has improved, questionable whether secondary to iron deficiency versus anemia of chronic disease. Nonspecific inflammatory markers were positive including ANA, however rheumatoid factor was negative. Spoke with rheumatologic colleague earlier today, recommended further workup with labs as noted below for any possible rheumatologic disorders. Also of concern was pyuria, patient was initially tested and emergency room for gonorrhea chlamydia however have been unable to track these results down. Urine cultures were negative, concern for sterile pyuria. Mother indicates that initially there was talk of having patient referred to urologist, would like to pursue this.  Overall patient states that he is feeling better, last temperature was 99 on Monday 3 days ago and nothing since. She denies proximal muscle weakness in upper arms or thighs, reports some chronic left shoulder pain, previous MRI noted labral abnormality but no surgeries. Patient still has some shoulder and hip pain although overall he is feeling much better in this regard. No unusual rashes, no other hair or skin changes. Most concerning symptom for him and his mother is the 40 pounds over the past few months as well as a cyclic fevers  Past medical, social and family history reviewed: No past medical history on file. Past Surgical History  Procedure Laterality Date  . Wisdom tooth extraction     Social History  Substance Use Topics  . Smoking status: Never Smoker   . Smokeless tobacco: Not on file  . Alcohol Use: No   No family history on file.  Current  Outpatient Prescriptions  Medication Sig Dispense Refill  . cephALEXin (KEFLEX) 500 MG capsule Take 1 capsule (500 mg total) by mouth 4 (four) times daily. 40 capsule 0  . doxycycline (VIBRAMYCIN) 100 MG capsule Take 1 capsule (100 mg total) by mouth 2 (two) times daily. 20 capsule 0  . ibuprofen (ADVIL,MOTRIN) 200 MG tablet Take 800 mg by mouth every 6 (six) hours as needed for fever.     . Ibuprofen-Diphenhydramine HCl (ADVIL PM) 200-25 MG CAPS Take 1 tablet by mouth at bedtime as needed (sleep).    . meloxicam (MOBIC) 7.5 MG tablet Take 1 tablet (7.5 mg total) by mouth 2 (two) times daily as needed for pain. 30 tablet 0   No current facility-administered medications for this visit.   No Known Allergies    Review of Systems: CONSTITUTIONAL:  Yes  fever, no chills, Yes  unintentional weight changes HEAD/EYES/EARS/NOSE/THROAT: No headache, no vision change, no hearing change, No  sore throat CARDIAC: No chest pain, no pressure/palpitations, no orthopnea RESPIRATORY: No  cough, No  shortness of breath/wheeze GASTROINTESTINAL: No nausea, no vomiting, no abdominal pain, no blood in stool, no diarrhea, no constipation MUSCULOSKELETAL: Yes  myalgia/arthralgia GENITOURINARY: No incontinence, No abnormal genital bleeding/discharge SKIN: No rash/wounds/concerning lesions HEM/ONC: No easy bruising/bleeding, no abnormal lymph node ENDOCRINE: No polyuria/polydipsia/polyphagia, no heat/cold intolerance  NEUROLOGIC: No weakness, no dizziness, no slurred speech     Exam:  BP 137/67 mmHg  Pulse 87  Wt 160 lb (72.576 kg)  SpO2 99% Constitutional: VSS, see above. General Appearance: alert, well-developed, well-nourished, NAD    No results found for this or any previous visit (from  the past 72 hour(s)).    ASSESSMENT/PLAN: Workup as below for possible rheumatologic disorders. It alongside that she would have something like lupus, possible HLA-B27 abnormality, possible other vasculitides, low  suspicion for these things given lack of muscle weakness or significant joint symptoms other than what is noted above, but due to patient and mother's concerns will certainly rule out any rheumatologic/autoimmune problem. Further follow-up and possible treatment with long steroid taper pending lab results, should have these back after the holiday weekend, patient advised that he will be called with any critical results over the weekend. Unlikely that he will need steroid treatment, given the overall clinically he feels improved, rheumatology recommended this as possible treatment of symptoms. Leukocytosis is improving, would also like to follow-up CBC within the next few months, again this will depend on the results and we get back from labs drawn today. Patient never had TB test read, however he denies any induration, apparently ER nurse looked at it as well though no negative TB test was documented and patient has had multiple clear chest x-rays. Would be nice to know if the gonorrhea/Lydia test was positive, though patient underwent has been treated with antibiotics for this. Sterile pyuria is also a concern, again consideration for gonorrhea/chlamydia infection and more remote possibilities certainly include TB or other autoimmune disorder. Patient has no urinary complaints at this time. We'll refer to urology per patient and mother requests and will repeat urinalysis today. Reassuring the patient feels overall improved, ER precautions reviewed. Follow-up pending lab results  Unintentional weight loss  Recurrent fever  Leukocytosis  Reactive thrombocytosis  Positive ANA (antinuclear antibody) - Plan: ENA 9 Panel, CK total and CKMB (cardiac)not at [facility], Aldolase, Lyme Ab/Western Blot Reflex, ANCA screen with reflex titer, HLA-B27 antigen  Joint pain - Plan: ENA 9 Panel, CK total and CKMB (cardiac)not at [facility], Aldolase, Lyme Ab/Western Blot Reflex, ANCA screen with reflex titer, HLA-B27  antigen  Abnormal urinalysis - Plan: Urinalysis, Ambulatory referral to Urology  Sterile pyuria - Plan: Ambulatory referral to Urology    Total time spent 40 minutes, greater than 50% of the visit was counseling and coordinating care for diagnosis of above conditions.

## 2015-08-24 LAB — CULTURE, BLOOD (ROUTINE X 2): CULTURE: NO GROWTH

## 2015-08-28 LAB — AFB CULTURE, BLOOD

## 2015-08-29 LAB — CK TOTAL AND CKMB (NOT AT ARMC): Total CK: 105 U/L (ref 7–232)

## 2015-08-29 LAB — URINALYSIS
Bilirubin Urine: NEGATIVE
Glucose, UA: NEGATIVE
HGB URINE DIPSTICK: NEGATIVE
KETONES UR: NEGATIVE
Leukocytes, UA: NEGATIVE
Nitrite: NEGATIVE
PH: 6.5 (ref 5.0–8.0)
Protein, ur: NEGATIVE
Specific Gravity, Urine: 1.021 (ref 1.001–1.035)

## 2015-08-29 LAB — C-ANCA TITER

## 2015-08-29 LAB — ANCA SCREEN W REFLEX TITER: ANCA SCREEN: POSITIVE — AB

## 2015-08-29 LAB — LYME AB/WESTERN BLOT REFLEX: B BURGDORFERI AB IGG+ IGM: 0.45 {ISR}

## 2015-08-30 LAB — ANTI-SCLERODERMA ANTIBODY: Scleroderma (Scl-70) (ENA) Antibody, IgG: <1

## 2015-08-30 LAB — ANTI-SMITH ANTIBODY: ENA SM Ab Ser-aCnc: <1

## 2015-08-30 LAB — SJOGRENS SYNDROME-B EXTRACTABLE NUCLEAR ANTIBODY: SSB (LA) (ENA) ANTIBODY, IGG: NEGATIVE

## 2015-08-30 LAB — ANTI-DNA ANTIBODY, DOUBLE-STRANDED: ds DNA Ab: 1 IU/mL

## 2015-08-30 LAB — RIBOSOMAL P PROTEIN AB: Ribosomal P Protein Ab: <1

## 2015-08-30 LAB — SJOGRENS SYNDROME-A EXTRACTABLE NUCLEAR ANTIBODY: SSA (RO) (ENA) ANTIBODY, IGG: NEGATIVE

## 2015-08-30 LAB — ANTI-RIBONUCLEIC ACID ANTIBODY: SM/RNP: NEGATIVE

## 2015-08-30 LAB — JO-1 ANTIBODY-IGG: Jo-1 Antibody, IgG: <1

## 2015-08-30 LAB — CENTROMERE ANTIBODIES: Centromere Ab Screen: <1

## 2015-08-31 ENCOUNTER — Ambulatory Visit (INDEPENDENT_AMBULATORY_CARE_PROVIDER_SITE_OTHER): Payer: PRIVATE HEALTH INSURANCE | Admitting: Osteopathic Medicine

## 2015-08-31 ENCOUNTER — Telehealth: Payer: Self-pay

## 2015-08-31 ENCOUNTER — Ambulatory Visit: Payer: PRIVATE HEALTH INSURANCE | Admitting: Osteopathic Medicine

## 2015-08-31 ENCOUNTER — Encounter: Payer: Self-pay | Admitting: Osteopathic Medicine

## 2015-08-31 VITALS — BP 97/82 | HR 83 | Temp 97.8°F | Ht 77.0 in | Wt 156.0 lb

## 2015-08-31 DIAGNOSIS — A689 Relapsing fever, unspecified: Secondary | ICD-10-CM | POA: Diagnosis not present

## 2015-08-31 DIAGNOSIS — R8281 Pyuria: Secondary | ICD-10-CM

## 2015-08-31 DIAGNOSIS — R112 Nausea with vomiting, unspecified: Secondary | ICD-10-CM

## 2015-08-31 DIAGNOSIS — R7989 Other specified abnormal findings of blood chemistry: Secondary | ICD-10-CM | POA: Diagnosis not present

## 2015-08-31 DIAGNOSIS — R634 Abnormal weight loss: Secondary | ICD-10-CM

## 2015-08-31 DIAGNOSIS — N390 Urinary tract infection, site not specified: Secondary | ICD-10-CM

## 2015-08-31 DIAGNOSIS — D75838 Other thrombocytosis: Secondary | ICD-10-CM

## 2015-08-31 DIAGNOSIS — R768 Other specified abnormal immunological findings in serum: Secondary | ICD-10-CM | POA: Insufficient documentation

## 2015-08-31 DIAGNOSIS — D649 Anemia, unspecified: Secondary | ICD-10-CM

## 2015-08-31 DIAGNOSIS — M2550 Pain in unspecified joint: Secondary | ICD-10-CM

## 2015-08-31 LAB — ALDOLASE: ALDOLASE: 6.2 U/L (ref <=8.1)

## 2015-08-31 MED ORDER — ONDANSETRON 8 MG PO TBDP: 8.0000 mg | ORAL_TABLET | Freq: Three times a day (TID) | ORAL | Status: DC | PRN

## 2015-08-31 NOTE — Patient Instructions (Signed)
Please call if you have not heard back about an appointment with rheumatology. They should call you to schedule an appointment.  Any other concerns, please let me know!  I'd like to see Seth Black back in about 6 weeks for a weight check, sooner if needed.

## 2015-08-31 NOTE — Progress Notes (Signed)
HPI: Seth Black is a 19 y.o. male who presents to [facility]  today for chief complaint of:  Chief Complaint  Patient presents with  . Fever  . Emesis   . Quality: fever 101.6 this morning then had ibuprofen about 7:00, normal temp now, no nausea now . Duration: this morning . Context: The patient that I have been seeing for weight loss, intermittent fevers, he was diagnosed with UTI in the emergency room but urine culture was negative, treated for presumed gonorrhea/chlamydia infection however these results were never obtained to confirm if he had this infection. I performed an extensive workup for possible causes of cyclic fevers, weight loss. He had multiple abnormal rheumatologic markers some nonspecific but he does have positive c-ANCA, at this time HLA-B27 is pending.  . Assoc signs/symptoms: No abdominal pain, emesis appeared as digested food no cough. Sinus no blood or bilious emesis. Reports had low appetite prior to vomiting, however her appetite has been normal. Has not eaten or drank anything out of the ordinary, possible sick contact at work with similar symptoms.   Past medical, social and family history reviewed: No past medical history on file. Past Surgical History  Procedure Laterality Date  . Wisdom tooth extraction     Social History  Substance Use Topics  . Smoking status: Never Smoker   . Smokeless tobacco: Not on file  . Alcohol Use: No   No family history on file.  Current Outpatient Prescriptions  Medication Sig Dispense Refill  . Ibuprofen-Diphenhydramine HCl (ADVIL PM) 200-25 MG CAPS Take 1 tablet by mouth at bedtime as needed (sleep).    . meloxicam (MOBIC) 7.5 MG tablet Take 1 tablet (7.5 mg total) by mouth 2 (two) times daily as needed for pain. 30 tablet 0  . cephALEXin (KEFLEX) 500 MG capsule Take 1 capsule (500 mg total) by mouth 4 (four) times daily. (Patient not taking: Reported on 08/31/2015) 40 capsule 0    No current facility-administered medications for this visit.   No Known Allergies    Review of Systems: CONSTITUTIONAL:  Yes  fever, no chills, Yes  unintentional weight changes HEAD/EYES/EARS/NOSE/THROAT: No headache, no vision change, no hearing change, No  sore throat CARDIAC: No chest pain, no pressure/palpitations, no orthopnea RESPIRATORY: No  cough, No  shortness of breath/wheeze GASTROINTESTINAL: (+) nausea - resolved, (+) x1 vomiting, no abdominal pain, no blood in stool, no diarrhea, no constipation MUSCULOSKELETAL: Yes  myalgia/arthralgia SKIN: No rash/wounds/concerning lesions HEM/ONC: No easy bruising/bleeding, no abnormal lymph node   Exam:  BP 97/82 mmHg  Pulse 83  Temp(Src) 97.8 F (36.6 C) (Oral)  Ht _0  (1.956 m)  Wt 156 lb (70.761 kg)  BMI 18.50 kg/m2 Constitutional: VSS, see above. General Appearance: alert, well-developed, well-nourished, NAD Eyes: Normal lids and conjunctive, non-icteric sclera,  Ears, Nose, Mouth, Throat: Normal external inspection ears/nares/mouth/lips/gums, MMM Respiratory: Normal respiratory effort. no wheeze, no rhonchi, no rales Cardiovascular: S1/S2 normal, no murmur, no rub/gallop auscultated. RRR.  Gastrointestinal: Nontender, no masses. No hepatomegaly, no splenomegaly. No hernia appreciated. Bowel sounds normal. Rectal exam deferred.  Musculoskeletal: Gait normal. No clubbing/cyanosis of digits. Marfanoid body habitus    Results for orders placed or performed in visit on 08/23/15 (from the past 72 hour(s))  CK total and CKMB (cardiac)not at [facility]     Status: None   Collection Time: 08/28/15  1:56 PM  Result Value Ref Range   Total CK 105 7 - 232 U/L   CK, MB <  0.7 0.0 - 5.0 ng/mL   Relative Index SEE NOTE 0.0 - 4.0    Comment: Result not calculated because one or more required values exceed analytical limits.   Aldolase     Status: None   Collection Time: 08/28/15  1:56 PM  Result Value Ref Range   Aldolase 6.2 <=8.1  U/L  Lyme Ab/Western Blot Reflex     Status: None   Collection Time: 08/28/15  1:56 PM  Result Value Ref Range   B burgdorferi Ab IgG+IgM 0.45 ISR    Comment: Antibody to Borrelia burgdorferi not detected.      ISR = Immune Status Ratio              <0.90         ISR       Negative              0.90 - 1.09   ISR       Equivocal              >=1.10        ISR       Positive   ANCA screen with reflex titer     Status: Abnormal   Collection Time: 08/28/15  1:56 PM  Result Value Ref Range   ANCA Screen c-ANCA POS (A)     Comment:             ** Normal Reference Range: Negative **   ANCA Screen includes evaluation for p-ANCA, c-ANCA and Atypical p-ANCA.   HLA-B27 antigen     Status: None (Preliminary result)   Collection Time: 08/28/15  1:56 PM  Result Value Ref Range   DNA Result:    Urinalysis     Status: None   Collection Time: 08/28/15  1:56 PM  Result Value Ref Range   Color, Urine YELLOW YELLOW    Comment: ** Please note change in unit of measure and reference range(s). **      APPearance CLEAR CLEAR   Specific Gravity, Urine 1.021 1.001 - 1.035   pH 6.5 5.0 - 8.0   Glucose, UA NEGATIVE NEGATIVE   Bilirubin Urine NEGATIVE NEGATIVE   Ketones, ur NEGATIVE NEGATIVE   Hgb urine dipstick NEGATIVE NEGATIVE   Protein, ur NEGATIVE NEGATIVE   Nitrite NEGATIVE NEGATIVE   Leukocytes, UA NEGATIVE NEGATIVE  Anti-DNA antibody, double-stranded     Status: None   Collection Time: 08/28/15  1:56 PM  Result Value Ref Range   ds DNA Ab 1 IU/mL    Comment:                                 IU/mL       Interpretation                               < or = 4    Negative                               5-9         Indeterminate                               > or = 10   Positive     Ribosomal P Protein  Ab     Status: None   Collection Time: 08/28/15  1:56 PM  Result Value Ref Range   Ribosomal P Protein Ab <1.0 NEG <1.0 NEG AI  Centromere Antibodies     Status: None   Collection Time:  08/28/15  1:56 PM  Result Value Ref Range   Centromere Ab Screen <1.0 NEG <1.0 NEG AI  Anti-scleroderma antibody     Status: None   Collection Time: 08/28/15  1:56 PM  Result Value Ref Range   Scleroderma (Scl-70) (ENA) Antibody, IgG <1.0 NEG <1.0 NEG AI  Jo-1 antibody-IgG     Status: None   Collection Time: 08/28/15  1:56 PM  Result Value Ref Range   Jo-1 Antibody, IgG <1.0 NEG <1.0 NEG AI  Sjogrens syndrome-A extractable nuclear antibody     Status: None   Collection Time: 08/28/15  1:56 PM  Result Value Ref Range   SSA (Ro) (ENA) Antibody, IgG <1.0 NEG <1.0 NEG AI  Sjogrens syndrome-B extractable nuclear antibody     Status: None   Collection Time: 08/28/15  1:56 PM  Result Value Ref Range   SSB (La) (ENA) Antibody, IgG <1.0 NEG <1.0 NEG AI  Anti-Smith antibody     Status: None   Collection Time: 08/28/15  1:56 PM  Result Value Ref Range   ENA SM Ab Ser-aCnc <1.0 NEG <1.0 NEG AI  Anti-ribonucleic acid antibody     Status: None   Collection Time: 08/28/15  1:56 PM  Result Value Ref Range   SM/RNP <1.0 NEG <1.0 NEG AI  C-ANCA Titer     Status: Abnormal   Collection Time: 08/28/15  1:56 PM  Result Value Ref Range   C-ANCA 1:40 (H) <1:20    Comment: The c-ANCA pattern and autoantibodies to Proteinase-3 (PR3) may be seen in most patients with Wegener's granulomatosis, but also in 30% of patients with microscopic polyangiitis and Churg Strauss syndrome. The fluorescence pattern of coarse granular cytoplasmic staining is based on antibody to PR3. These autoantibodies are clinically helpful in determining active versus inactive disease states, in monitoring the effect of therapy, and in evaluating the possibility of relapse.       ASSESSMENT/PLAN: Reassuring that patient is overall feeling better, per record review weight appear to be trending up. However persistent fevers, fatigue, joint pain are concerning. Inflammatory markers and positive c-ANCA are concerning. Not sure what  to make of lab results, will refer to rheumatology for second opinion and possible further evaluation. ER precautions reviewed.   Recurrent fever - Plan: Ambulatory referral to Rheumatology  Unintentional weight loss - Plan: Ambulatory referral to Rheumatology  Non-intractable vomiting with nausea, unspecified vomiting type - Plan: ondansetron (ZOFRAN-ODT) 8 MG disintegrating tablet    Return in about 6 weeks (around 10/12/2015), or sooner if symptoms worsen or fail to improve, for weight check.

## 2015-08-31 NOTE — Telephone Encounter (Signed)
We discussed lab results at his appointment today.

## 2015-08-31 NOTE — Telephone Encounter (Signed)
Patient's mom called and would like the results of his blood work. Please advise.

## 2015-09-05 LAB — HLA-B27 ANTIGEN: DNA Result:: NOT DETECTED

## 2015-09-08 LAB — OTHER SOLSTAS TEST

## 2015-09-08 LAB — AFB CULTURE, BLOOD

## 2015-10-03 ENCOUNTER — Telehealth: Payer: Self-pay | Admitting: Hematology and Oncology

## 2015-10-03 NOTE — Telephone Encounter (Signed)
new patient appt-s/w patient and gave np appt for 01/5 @ 12:30 w/Dr. Alvy Bimler.

## 2015-10-05 ENCOUNTER — Encounter: Payer: Self-pay | Admitting: Hematology and Oncology

## 2015-10-05 ENCOUNTER — Telehealth: Payer: Self-pay | Admitting: Hematology and Oncology

## 2015-10-05 ENCOUNTER — Ambulatory Visit (HOSPITAL_BASED_OUTPATIENT_CLINIC_OR_DEPARTMENT_OTHER): Payer: PRIVATE HEALTH INSURANCE | Admitting: Hematology and Oncology

## 2015-10-05 VITALS — BP 121/72 | HR 106 | Temp 98.7°F | Resp 18 | Wt 162.7 lb

## 2015-10-05 DIAGNOSIS — D75838 Other thrombocytosis: Secondary | ICD-10-CM

## 2015-10-05 DIAGNOSIS — D638 Anemia in other chronic diseases classified elsewhere: Secondary | ICD-10-CM | POA: Diagnosis not present

## 2015-10-05 DIAGNOSIS — R768 Other specified abnormal immunological findings in serum: Secondary | ICD-10-CM | POA: Diagnosis not present

## 2015-10-05 DIAGNOSIS — R7989 Other specified abnormal findings of blood chemistry: Secondary | ICD-10-CM

## 2015-10-05 DIAGNOSIS — E559 Vitamin D deficiency, unspecified: Secondary | ICD-10-CM

## 2015-10-05 DIAGNOSIS — D72829 Elevated white blood cell count, unspecified: Secondary | ICD-10-CM | POA: Diagnosis not present

## 2015-10-05 DIAGNOSIS — D473 Essential (hemorrhagic) thrombocythemia: Secondary | ICD-10-CM | POA: Diagnosis not present

## 2015-10-05 NOTE — Telephone Encounter (Signed)
Gave patient avs report and appointments for January  °

## 2015-10-06 DIAGNOSIS — E559 Vitamin D deficiency, unspecified: Secondary | ICD-10-CM | POA: Insufficient documentation

## 2015-10-06 NOTE — Assessment & Plan Note (Signed)
I recommend vitamin D supplementation. 

## 2015-10-06 NOTE — Assessment & Plan Note (Signed)
He has bizarre constellation of leukocytosis, anemia, thrombocytosis, joint pain and abnormal screening tests with elevated ANA with high titer. He is currently taking prednisone with improvement of his symptoms. Overall, I felt that the leukocytosis is reactive in nature, likely to some autoimmune conditions or known infection or environmental exposure of unknown chemicals I do not feel strongly to redraw his blood work now as he would be inaccurate in the setting of prednisone therapy. 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 I gave him a letter to take time off work and plan to retest his blood work at the end of the month. We also discussed potential referral to [facility] for evaluation and he agreed with the plan but would like to defer that until next month which I think is acceptable.

## 2015-10-06 NOTE — Progress Notes (Signed)
I recommend vitamin D supplement

## 2015-10-06 NOTE — Assessment & Plan Note (Signed)
This is likely anemia of chronic disease. The patient denies recent history of bleeding such as epistaxis, hematuria or hematochezia. He is asymptomatic from the anemia. We will observe for now.  He does not require transfusion now.   

## 2015-10-06 NOTE — Assessment & Plan Note (Signed)
This is likely reactive in nature. Observation only for now.

## 2015-10-06 NOTE — Progress Notes (Signed)
[place] NOTE  Patient Care Team: Emeterio Reeve, DO as PCP - General (Osteopathic Medicine) Heath Lark, MD as Consulting Physician (Hematology and Oncology) Heath Lark, MD as Consulting Physician (Hematology and Oncology) Gavin Pound, MD as Consulting Physician (Rheumatology)  CHIEF COMPLAINTS/PURPOSE OF CONSULTATION:  Leukocytosis, anemia, reactive thrombocytosis, elevated ANA, joint pain and weight loss  HISTORY OF PRESENTING ILLNESS:  Seth Black 20 y.o. male is here because of elevated WBC. 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 His occupation started around August 2015. Over the past 5 months, he complained of significant bilateral shoulder pain and hip pain with associated early morning stiffness. His mother disclosed history of Lyme disease in 2010 and some viral infection. 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 He has 2 dogs at home and he has been camping outdoor but he does not recall any recent tick bites. He denies any sick contact. There were no family history of autoimmune disorders. Over the past few months, he had progressive anorexia and 40 pound weight loss. He has been through multiple physicians with significant visits including emergency room visits, referral to [facility] and had seen 2 different rheumatologist. He was placed on prednisone taper about 2 weeks ago and has symptomatic improvement with 10 pound weight gain since then and less joint pain. He complained of some low-grade fevers recently. He has been tested for Lyme disease and was told he does not have obvious infection. He was noted to have elevated ANA. HLA B27  was negative. He denies recent infection. The last prescription antibiotics was more than 3 months ago There is not reported symptoms of sinus congestion, cough, urinary frequency/urgency or dysuria, diarrhea, or abnormal skin rash.  He had no prior history or diagnosis of cancer. His age appropriate screening programs are up-to-date. The patient is not a smoker  The patient denies any recent signs or symptoms of bleeding such as spontaneous epistaxis, hematuria or hematochezia.  MEDICAL HISTORY:  History reviewed. No pertinent past medical history.  SURGICAL HISTORY: Past Surgical History  Procedure Laterality Date  . Wisdom tooth extraction      SOCIAL HISTORY: Social History   Social History  . Marital Status: Single    Spouse Name: N/A  . Number of Children: N/A  . Years of Education: N/A   Occupational History  . Not on file.   Social History Main Topics  . Smoking status: Never Smoker   . Smokeless tobacco: Never Used  . Alcohol Use: No  . Drug Use: No  . Sexual Activity: Not on file   Other Topics Concern  . Not on file   Social History Narrative    FAMILY HISTORY: History reviewed. No pertinent family history.  ALLERGIES:  has No Known Allergies.  MEDICATIONS:  Current Outpatient Prescriptions  Medication Sig Dispense Refill  . predniSONE (DELTASONE) 5 MG tablet Take 10 mg by mouth daily.     No current facility-administered medications for this visit.    REVIEW OF SYSTEMS:   Constitutional: Denies chills or abnormal night sweats  Eyes: Denies blurriness of vision, double vision or watery eyes Ears, nose, mouth, throat, and face: Denies mucositis or sore throat Respiratory: Denies  cough, dyspnea or wheezes Cardiovascular: Denies palpitation, chest discomfort or lower extremity swelling Gastrointestinal:  Denies nausea, heartburn or change in bowel habits Skin: Denies abnormal skin rashes Lymphatics: Denies new lymphadenopathy or easy  bruising Neurological:Denies numbness, tingling or new weaknesses Behavioral/Psych: Mood is stable, no new changes  All other systems were reviewed with the patient and are negative.  PHYSICAL EXAMINATION: ECOG PERFORMANCE STATUS: 1 - Symptomatic but completely ambulatory  Filed Vitals:   10/05/15 1235  BP: 121/72  Pulse: 106  Temp: 98.7 F (37.1 C)  Resp: 18   Filed Weights   10/05/15 1235  Weight: 162 lb 11.2 oz (73.8 kg)    GENERAL:alert, no distress and comfortable SKIN: skin color, texture, turgor are normal, no rashes or significant lesions. Noted diesel staining of his skin EYES: normal, conjunctiva are pink and non-injected, sclera clear OROPHARYNX:no exudate, no erythema and lips, buccal mucosa, and tongue normal  NECK: supple, thyroid normal size, non-tender, without nodularity LYMPH:  no palpable lymphadenopathy in the cervical, axillary or inguinal LUNGS: clear to auscultation and percussion with normal breathing effort HEART: regular rate & rhythm and no murmurs and no lower extremity edema ABDOMEN:abdomen soft, non-tender and normal bowel sounds Musculoskeletal:no cyanosis of digits and no clubbing  PSYCH: alert & oriented x 3 with fluent speech NEURO: no focal motor/sensory deficits  LABORATORY DATA:  I have reviewed the data as listed Recent Results (from the past 2160 hour(s))  CBC with Differential/Platelet     Status: Abnormal   Collection Time: 08/16/15  1:03 AM  Result Value Ref Range   WBC 18.2 (H) 4.0 - 10.5 K/uL   RBC 3.67 (L) 4.22 - 5.81 MIL/uL   Hemoglobin 9.9 (L) 13.0 - 17.0 g/dL   HCT 30.5 (L) 39.0 - 52.0 %   MCV 83.1 78.0 - 100.0 fL   MCH 27.0 26.0 - 34.0 pg   MCHC 32.5 30.0 - 36.0 g/dL   RDW 13.2 11.5 - 15.5 %   Platelets 493 (H) 150 - 400 K/uL   Neutrophils Relative % 83 %   Neutro Abs 15.1 (H) 1.7 - 7.7 K/uL   Lymphocytes Relative 10 %   Lymphs Abs 1.8 0.7 - 4.0 K/uL   Monocytes Relative 7 %   Monocytes Absolute 1.3 (H) 0.1 - 1.0  K/uL   Eosinophils Relative 0 %   Eosinophils Absolute 0.0 0.0 - 0.7 K/uL   Basophils Relative 0 %   Basophils Absolute 0.0 0.0 - 0.1 K/uL  Comprehensive metabolic panel     Status: Abnormal   Collection Time: 08/16/15  1:03 AM  Result Value Ref Range   Sodium 132 (L) 135 - 145 mmol/L   Potassium 3.7 3.5 - 5.1 mmol/L   Chloride 100 (L) 101 - 111 mmol/L   CO2 26 22 - 32 mmol/L   Glucose, Bld 100 (H) 65 - 99 mg/dL   BUN 19 6 - 20 mg/dL   Creatinine, Ser 0.89 0.61 - 1.24 mg/dL   Calcium 8.8 (L) 8.9 - 10.3 mg/dL   Total Protein 7.5 6.5 - 8.1 g/dL   Albumin 3.7 3.5 - 5.0 g/dL   AST 14 (L) 15 - 41 U/L   ALT 11 (L) 17 - 63 U/L   Alkaline Phosphatase 78 38 - 126 U/L   Total Bilirubin 0.6 0.3 - 1.2 mg/dL   GFR calc non Af Amer >60 >60 mL/min   GFR calc Af Amer >60 >60 mL/min    Comment: (NOTE) The eGFR has been  calculated using the CKD EPI equation. This calculation has not been validated in all clinical situations. eGFR's persistently <60 mL/min signify possible Chronic Kidney Disease.    Anion gap 6 5 - 15  HIV antibody     Status: None   Collection Time: 08/16/15  1:03 AM  Result Value Ref Range   HIV Screen 4th Generation wRfx Non Reactive Non Reactive    Comment: (NOTE) Performed At: [facility] [place], [place] [id number] Lindon Romp MD [id number]   RPR     Status: None   Collection Time: 08/16/15  1:03 AM  Result Value Ref Range   RPR Ser Ql Non Reactive Non Reactive    Comment: (NOTE) Performed At: [facility] [address] [place], [place] [id number] Lindon Romp MD [id number]   Urinalysis, Routine w reflex microscopic (not at [facility])     Status: Abnormal   Collection Time: 08/16/15  1:30 AM  Result Value Ref Range   Color, Urine YELLOW YELLOW   APPearance CLEAR CLEAR   Specific Gravity, Urine 1.017 1.005 - 1.030   pH 7.0 5.0 - 8.0   Glucose, UA NEGATIVE NEGATIVE mg/dL   Hgb urine dipstick NEGATIVE NEGATIVE    Bilirubin Urine NEGATIVE NEGATIVE   Ketones, ur NEGATIVE NEGATIVE mg/dL   Protein, ur NEGATIVE NEGATIVE mg/dL   Nitrite NEGATIVE NEGATIVE   Leukocytes, UA MODERATE (A) NEGATIVE  Urine microscopic-add on     Status: Abnormal   Collection Time: 08/16/15  1:30 AM  Result Value Ref Range   Squamous Epithelial / LPF 0-5 (A) NONE SEEN    Comment: Please note change in reference range.   WBC, UA TOO NUMEROUS TO COUNT 0 - 5 WBC/hpf    Comment: Please note change in reference range.   RBC / HPF NONE SEEN 0 - 5 RBC/hpf    Comment: Please note change in reference range.   Bacteria, UA NONE SEEN NONE SEEN    Comment: Please note change in reference range.  Urine culture     Status: None   Collection Time: 08/16/15  1:30 AM  Result Value Ref Range   Specimen Description URINE, CLEAN CATCH    Special Requests NONE    Culture      NO GROWTH 1 DAY Performed at [facility]    Report Status 08/17/2015 FINAL   POCT Urinalysis Dipstick     Status: None   Collection Time: 08/17/15  9:31 AM  Result Value Ref Range   Color, UA YELLOW    Clarity, UA CLEAR    Glucose, UA NEG    Bilirubin, UA NEG    Ketones, UA NEG    Spec Grav, UA >=1.030    Blood, UA NEG    pH, UA 5.5    Protein, UA TRACE    Urobilinogen, UA 0.2    Nitrite, UA NEG    Leukocytes, UA Negative Negative  CBC with Differential/Platelet     Status: Abnormal   Collection Time: 08/17/15 10:20 AM  Result Value Ref Range   WBC 18.3 (H) 4.0 - 10.5 K/uL   RBC 4.11 (L) 4.22 - 5.81 MIL/uL   Hemoglobin 10.8 (L) 13.0 - 17.0 g/dL   HCT 33.3 (L) 39.0 - 52.0 %   MCV 81.0 78.0 - 100.0 fL   MCH 26.3 26.0 - 34.0 pg   MCHC 32.4 30.0 - 36.0 g/dL   RDW 13.5 11.5 - 15.5 %   Platelets 564 (H) 150 -  400 K/uL   MPV 9.1 8.6 - 12.4 fL   Neutrophils Relative % 87 (H) 43 - 77 %   Neutro Abs 15.9 (H) 1.7 - 7.7 K/uL   Lymphocytes Relative 7 (L) 12 - 46 %   Lymphs Abs 1.3 0.7 - 4.0 K/uL   Monocytes Relative 6 3 - 12 %   Monocytes Absolute 1.1  (H) 0.1 - 1.0 K/uL   Eosinophils Relative 0 0 - 5 %   Eosinophils Absolute 0.0 0.0 - 0.7 K/uL   Basophils Relative 0 0 - 1 %   Basophils Absolute 0.0 0.0 - 0.1 K/uL   Smear Review Criteria for review not met   TSH     Status: None   Collection Time: 08/17/15 10:20 AM  Result Value Ref Range   TSH 1.466 0.350 - 4.500 uIU/mL  COMPLETE METABOLIC PANEL WITH GFR     Status: Abnormal   Collection Time: 08/17/15 10:20 AM  Result Value Ref Range   Sodium 137 135 - 146 mmol/L   Potassium 4.0 3.5 - 5.3 mmol/L   Chloride 98 98 - 110 mmol/L   CO2 26 20 - 31 mmol/L   Glucose, Bld 61 (L) 65 - 99 mg/dL   BUN 11 7 - 25 mg/dL   Creat 0.64 0.60 - 1.35 mg/dL   Total Bilirubin 0.5 0.2 - 1.2 mg/dL   Alkaline Phosphatase 93 40 - 115 U/L   AST 14 10 - 40 U/L   ALT 8 (L) 9 - 46 U/L   Total Protein 7.7 6.1 - 8.1 g/dL   Albumin 3.9 3.6 - 5.1 g/dL   Calcium 9.5 8.6 - 10.3 mg/dL   GFR, Est African American >89 >=60 mL/min   GFR, Est Non African American >89 >=60 mL/min    Comment:   The estimated GFR is a calculation valid for adults (>=28 years old) that uses the CKD-EPI algorithm to adjust for age and sex. It is   not to be used for children, pregnant women, hospitalized patients,    patients on dialysis, or with rapidly changing kidney function. According to the NKDEP, eGFR >89 is normal, 60-89 shows mild impairment, 30-59 shows moderate impairment, 15-29 shows severe impairment and <15 is ESRD.     C-reactive protein     Status: Abnormal   Collection Time: 08/17/15 10:20 AM  Result Value Ref Range   CRP 10.2 (H) <0.60 mg/dL  Antinuclear Antib (ANA)     Status: Abnormal   Collection Time: 08/17/15 10:20 AM  Result Value Ref Range   Anit Nuclear Antibody(ANA) POS (A) NEGATIVE  Rheumatoid Factor     Status: None   Collection Time: 08/17/15 10:20 AM  Result Value Ref Range   Rhuematoid fact SerPl-aCnc <10 <=14 IU/mL    Comment:                            Interpretive Table                      Low Positive: 15 - 41 IU/mL                     High Positive:  >= 42 IU/mL    In addition to the RF result, and clinical symptoms including joint  involvement, the 2010 ACR Classification Criteria for  scoring/diagnosing Rheumatoid Arthritis include the results of the  following tests:  CRP (49179), ESR (15010), and  CCP (APCA) (34742).  www.rheumatology.org/practice/clinical/classification/ra/ra_2010.asp   Sedimentation rate     Status: Abnormal   Collection Time: 08/17/15 10:20 AM  Result Value Ref Range   Sed Rate 63 (H) 0 - 15 mm/hr  High sensitivity CRP     Status: Abnormal   Collection Time: 08/17/15 10:20 AM  Result Value Ref Range   CRP, High Sensitivity 102.0 (H) mg/L    Comment:                                Cardio CRP Cutpoints      For ages > 17 years:          CRP mg/L            Risk         < 1.0      Lower relative cardiovascular risk according to                    AHA/CDC guidelines     1.0 - 3.0      Average relative cardiovascular risk according                    to AHA/CDC guidelines   > 3.0 - 10.0     Higher relative cardiovascular risk according to                    AHA/CDC guidelines.  Consider retesting in 1 to 2                    weeks to exclude a benign transient elevation in                    the baseline CRP value secondary to infection or                    inflammation.         > 10.0     Persistent elevation, upon retesting, may be                    associated with infection and inflammation                    according to AHA/CDC guidelines.   VITAMIN D 25 Hydroxy (Vit-D Deficiency, Fractures)     Status: Abnormal   Collection Time: 08/17/15 10:20 AM  Result Value Ref Range   Vit D, 25-Hydroxy 28 (L) 30 - 100 ng/mL    Comment: Vitamin D Status           25-OH Vitamin D        Deficiency                <20 ng/mL        Insufficiency         20 - 29 ng/mL        Optimal             > or = 30 ng/mL   For 25-OH Vitamin D testing on  patients on D2-supplementation and patients for whom quantitation of D2 and D3 fractions is required, the QuestAssureD 25-OH VIT D, (D2,D3), LC/MS/MS is recommended: order code [phone] (patients > 2 yrs).   Ferritin     Status: Abnormal   Collection Time: 08/17/15 10:20 AM  Result Value Ref Range   Ferritin 972 (H) 22 -  322 ng/mL  Folate RBC     Status: None   Collection Time: 08/17/15 10:20 AM  Result Value Ref Range   RBC Folate CANCELED >280 ng/mL    Comment: Reference range not established for pediatric patients.  Result canceled by the ancillary   Iron and TIBC     Status: Abnormal   Collection Time: 08/17/15 10:20 AM  Result Value Ref Range   Iron 12 (L) 50 - 195 ug/dL   UIBC 243 125 - 400 ug/dL   TIBC 255 250 - 425 ug/dL   %SAT 5 (L) 15 - 60 %  Reticulocytes     Status: Abnormal   Collection Time: 08/17/15 10:20 AM  Result Value Ref Range   Retic Ct Pct 1.1 0.4 - 2.3 %   RBC. 4.11 (L) 4.22 - 5.81 MIL/uL   ABS Retic 45.2 19.0 - 186.0 K/uL  Anti-nuclear ab-titer (ANA titer)     Status: Abnormal   Collection Time: 08/17/15 10:20 AM  Result Value Ref Range   ANA Pattern 1 SPECKLED    ANA Titer 1 1:160 (H) titer    Comment:           Reference Range           < 1:40      Negative             1:40-1:80 Low Antibody level           > 1:80      Elevated Antibody level   Pathologist smear review     Status: None   Collection Time: 08/17/15 10:22 AM  Result Value Ref Range   Path Review SEE NOTE     Comment: Leukocytosis due to absolute granulocytosis. Myeloid population consists predominantly of mature segmented neutrophils with reactive changes. No immature cells are identified.    Anemia with features suggestive of iron deficiency.   The overall findings in this smear are consistent with a reactive thrombocytosis.  Clinical correlation is recommended. Reviewed by Francis Gaines Mammarappallil MD (Electronic Signature on File) 08/18/15   AFB culture, blood     Status:  None (Preliminary result)   Collection Time: 08/17/15 10:24 AM  Result Value Ref Range   Culture, AFB Blood    Other [facility] Test     Status: None   Collection Time: 08/17/15 10:24 AM  Result Value Ref Range   Miscellaneous Test BLOOD CULTURE TC 389    LAB - Referred Assay QST [place]    Miscellaneous Test Results SEE BELOW     Comment: TEST NAME               IN RANGE   OUT OF RANGE   REFERENCE RANGE ==================================================================   CULTURE, BLOOD   MICRO NUMBER:     [id number] TEST STATUS:      FINAL SPECIMEN SOURCE:  BLOOD SPECIMEN QUALITY: ADEQUATE RESULT:           No growth after 14 days   TRANSPORT MEDIA:  Aerobic and anaerobic bottle received.   AFB culture, blood     Status: None   Collection Time: 08/17/15 10:26 AM  Result Value Ref Range   Culture, AFB Blood       Comment:   MYCOBACTERIUM, CULTURE, BLOOD       MICRO NUMBER:      [id number]   TEST STATUS:       FINAL   SPECIMEN SOURCE:   BLOOD   SPECIMEN QUALITY:  INADEQUATE  RESULT:            Test not performed. No suitable specimen received.     Other [facility] Test     Status: None   Collection Time: 08/17/15 10:26 AM  Result Value Ref Range   Miscellaneous Test BLOOD CULTURE TC 7002    LAB - Referred Assay QST [place]    Miscellaneous Test Results SEE BELOW     Comment: TEST NAME               IN RANGE   OUT OF RANGE   REFERENCE RANGE ==================================================================   CULTURE, BLOOD, MANUAL   MICRO NUMBER:     [id number] TEST STATUS:      FINAL SPECIMEN SOURCE:  BLOOD SPECIMEN QUALITY: ADEQUATE RESULT:           No growth after 14 days   TRANSPORT MEDIA:  Aerobic and anaerobic bottle received.   Comprehensive metabolic panel     Status: Abnormal   Collection Time: 08/19/15  1:57 AM  Result Value Ref Range   Sodium 136 135 - 145 mmol/L   Potassium 3.4 (L) 3.5 - 5.1 mmol/L   Chloride 100 (L) 101 - 111 mmol/L   CO2 24 22 - 32  mmol/L   Glucose, Bld 96 65 - 99 mg/dL   BUN 12 6 - 20 mg/dL   Creatinine, Ser 0.80 0.61 - 1.24 mg/dL   Calcium 9.4 8.9 - 10.3 mg/dL   Total Protein 8.4 (H) 6.5 - 8.1 g/dL   Albumin 3.7 3.5 - 5.0 g/dL   AST 16 15 - 41 U/L   ALT 11 (L) 17 - 63 U/L   Alkaline Phosphatase 89 38 - 126 U/L   Total Bilirubin 0.4 0.3 - 1.2 mg/dL   GFR calc non Af Amer >60 >60 mL/min   GFR calc Af Amer >60 >60 mL/min    Comment: (NOTE) The eGFR has been calculated using the CKD EPI equation. This calculation has not been validated in all clinical situations. eGFR's persistently <60 mL/min signify possible Chronic Kidney Disease.    Anion gap 12 5 - 15  CBC WITH DIFFERENTIAL     Status: Abnormal   Collection Time: 08/19/15  1:57 AM  Result Value Ref Range   WBC 11.9 (H) 4.0 - 10.5 K/uL   RBC 3.91 (L) 4.22 - 5.81 MIL/uL   Hemoglobin 10.5 (L) 13.0 - 17.0 g/dL   HCT 32.3 (L) 39.0 - 52.0 %   MCV 82.6 78.0 - 100.0 fL   MCH 26.9 26.0 - 34.0 pg   MCHC 32.5 30.0 - 36.0 g/dL   RDW 13.1 11.5 - 15.5 %   Platelets 567 (H) 150 - 400 K/uL   Neutrophils Relative % 72 %   Neutro Abs 8.6 (H) 1.7 - 7.7 K/uL   Lymphocytes Relative 16 %   Lymphs Abs 1.9 0.7 - 4.0 K/uL   Monocytes Relative 11 %   Monocytes Absolute 1.3 (H) 0.1 - 1.0 K/uL   Eosinophils Relative 1 %   Eosinophils Absolute 0.1 0.0 - 0.7 K/uL   Basophils Relative 0 %   Basophils Absolute 0.1 0.0 - 0.1 K/uL  Blood Culture (routine x 2)     Status: None   Collection Time: 08/19/15  2:05 AM  Result Value Ref Range   Specimen Description BLOOD LEFT ANTECUBITAL    Special Requests BOTTLES DRAWN AEROBIC AND ANAEROBIC 5ML    Culture      NO GROWTH 5 DAYS Performed  at [facility]    Report Status 08/24/2015 FINAL   I-Stat CG4 Lactic Acid, ED  (not at  [facility])     Status: None   Collection Time: 08/19/15  2:08 AM  Result Value Ref Range   Lactic Acid, Venous 0.89 0.5 - 2.0 mmol/L  Urinalysis, Routine w reflex microscopic (not at [facility])     Status:  Abnormal   Collection Time: 08/19/15  2:43 AM  Result Value Ref Range   Color, Urine YELLOW YELLOW   APPearance CLEAR CLEAR   Specific Gravity, Urine 1.021 1.005 - 1.030   pH 6.0 5.0 - 8.0   Glucose, UA NEGATIVE NEGATIVE mg/dL   Hgb urine dipstick NEGATIVE NEGATIVE   Bilirubin Urine NEGATIVE NEGATIVE   Ketones, ur NEGATIVE NEGATIVE mg/dL   Protein, ur NEGATIVE NEGATIVE mg/dL   Nitrite NEGATIVE NEGATIVE   Leukocytes, UA SMALL (A) NEGATIVE  Urine culture     Status: None   Collection Time: 08/19/15  2:43 AM  Result Value Ref Range   Specimen Description URINE, CLEAN CATCH    Special Requests NONE    Culture      NO GROWTH 1 DAY Performed at [facility]    Report Status 08/20/2015 FINAL   Urine microscopic-add on     Status: Abnormal   Collection Time: 08/19/15  2:43 AM  Result Value Ref Range   Squamous Epithelial / LPF 0-5 (A) NONE SEEN    Comment: Please note change in reference range.   WBC, UA 6-30 0 - 5 WBC/hpf    Comment: Please note change in reference range.   RBC / HPF 0-5 0 - 5 RBC/hpf    Comment: Please note change in reference range.   Bacteria, UA RARE (A) NONE SEEN    Comment: Please note change in reference range.  RPR     Status: None   Collection Time: 08/19/15  2:56 AM  Result Value Ref Range   RPR Ser Ql Non Reactive Non Reactive    Comment: (NOTE) Performed At: [facility] [address] [place], [place] [id number] Lindon Romp MD [id number]   Influenza panel by PCR (type A & B, H1N1)     Status: None   Collection Time: 08/19/15  3:57 AM  Result Value Ref Range   Influenza A By PCR NEGATIVE NEGATIVE   Influenza B By PCR NEGATIVE NEGATIVE   H1N1 flu by pcr NOT DETECTED NOT DETECTED    Comment:        The Xpert Flu assay (FDA approved for nasal aspirates or washes and nasopharyngeal swab specimens), is intended as an aid in the diagnosis of influenza and should not be used as a sole basis for treatment. Performed at  [facility]   CK total and CKMB (cardiac)not at [facility]     Status: None   Collection Time: 08/28/15  1:56 PM  Result Value Ref Range   Total CK 105 7 - 232 U/L   CK, MB <0.7 0.0 - 5.0 ng/mL   Relative Index SEE NOTE 0.0 - 4.0    Comment: Result not calculated because one or more required values exceed analytical limits.   Aldolase     Status: None   Collection Time: 08/28/15  1:56 PM  Result Value Ref Range   Aldolase 6.2 <=8.1 U/L  Lyme Ab/Western Blot Reflex     Status: None   Collection Time: 08/28/15  1:56 PM  Result Value Ref Range  B burgdorferi Ab IgG+IgM 0.45 ISR    Comment: Antibody to Borrelia burgdorferi not detected.      ISR = Immune Status Ratio              <0.90         ISR       Negative              0.90 - 1.09   ISR       Equivocal              >=1.10        ISR       Positive   ANCA screen with reflex titer     Status: Abnormal   Collection Time: 08/28/15  1:56 PM  Result Value Ref Range   ANCA Screen c-ANCA POS (A)     Comment:             ** Normal Reference Range: Negative **   ANCA Screen includes evaluation for p-ANCA, c-ANCA and Atypical p-ANCA.   HLA-B27 antigen     Status: None   Collection Time: 08/28/15  1:56 PM  Result Value Ref Range   DNA Result: Not Detected     Comment:                     ** Normal Reference Range: Not Detected **     TEST INFORMATION:   The presence of HLA-B27 is not diagnostic of Ankylosing Spondylitis but is associated with increased risk for AS or other related autoimmune disorders (i.e. Reiter's syndrome).   HLA-B27 is found in 80-90% of patients with Ankylosing Spondylitis (AS). HLA-B27 is also found in approximately 10% of patients without autoimmune disease.   Ankylosing Spondylitis is a type of arthritis causing inflammation of the joints of the spine affecting about 129 of 100,000 patients in the [place] AS occurs more often in men than women with the onset of symptoms between the ages of 53 to  69 years.   Test Method: Real-time polymerase chain reaction (PCR) amplification   This test was developed and its analytical performance characteristics have been determined by [employer].  It has not been cleared or approved by FDA. This assay has been validated pursuant to the CLIA regulations and is  used for clinical purposes.   Urinalysis     Status: None   Collection Time: 08/28/15  1:56 PM  Result Value Ref Range   Color, Urine YELLOW YELLOW    Comment: ** Please note change in unit of measure and reference range(s). **      APPearance CLEAR CLEAR   Specific Gravity, Urine 1.021 1.001 - 1.035   pH 6.5 5.0 - 8.0   Glucose, UA NEGATIVE NEGATIVE   Bilirubin Urine NEGATIVE NEGATIVE   Ketones, ur NEGATIVE NEGATIVE   Hgb urine dipstick NEGATIVE NEGATIVE   Protein, ur NEGATIVE NEGATIVE   Nitrite NEGATIVE NEGATIVE   Leukocytes, UA NEGATIVE NEGATIVE  Anti-DNA antibody, double-stranded     Status: None   Collection Time: 08/28/15  1:56 PM  Result Value Ref Range   ds DNA Ab 1 IU/mL    Comment:                                 IU/mL       Interpretation                               <  or = 4    Negative                               5-9         Indeterminate                               > or = 10   Positive     Ribosomal P Protein Ab     Status: None   Collection Time: 08/28/15  1:56 PM  Result Value Ref Range   Ribosomal P Protein Ab <1.0 NEG <1.0 NEG AI  Centromere Antibodies     Status: None   Collection Time: 08/28/15  1:56 PM  Result Value Ref Range   Centromere Ab Screen <1.0 NEG <1.0 NEG AI  Anti-scleroderma antibody     Status: None   Collection Time: 08/28/15  1:56 PM  Result Value Ref Range   Scleroderma (Scl-70) (ENA) Antibody, IgG <1.0 NEG <1.0 NEG AI  Jo-1 antibody-IgG     Status: None   Collection Time: 08/28/15  1:56 PM  Result Value Ref Range   Jo-1 Antibody, IgG <1.0 NEG <1.0 NEG AI  Sjogrens syndrome-A extractable nuclear antibody      Status: None   Collection Time: 08/28/15  1:56 PM  Result Value Ref Range   SSA (Ro) (ENA) Antibody, IgG <1.0 NEG <1.0 NEG AI  Sjogrens syndrome-B extractable nuclear antibody     Status: None   Collection Time: 08/28/15  1:56 PM  Result Value Ref Range   SSB (La) (ENA) Antibody, IgG <1.0 NEG <1.0 NEG AI  Anti-Smith antibody     Status: None   Collection Time: 08/28/15  1:56 PM  Result Value Ref Range   ENA SM Ab Ser-aCnc <1.0 NEG <1.0 NEG AI  Anti-ribonucleic acid antibody     Status: None   Collection Time: 08/28/15  1:56 PM  Result Value Ref Range   SM/RNP <1.0 NEG <1.0 NEG AI  C-ANCA Titer     Status: Abnormal   Collection Time: 08/28/15  1:56 PM  Result Value Ref Range   C-ANCA 1:40 (H) <1:20    Comment: The c-ANCA pattern and autoantibodies to Proteinase-3 (PR3) may be seen in most patients with Wegener's granulomatosis, but also in 30% of patients with microscopic polyangiitis and Churg Strauss syndrome. The fluorescence pattern of coarse granular cytoplasmic staining is based on antibody to PR3. These autoantibodies are clinically helpful in determining active versus inactive disease states, in monitoring the effect of therapy, and in evaluating the possibility of relapse.     ASSESSMENT & PLAN Leukocytosis He has bizarre constellation of leukocytosis, anemia, thrombocytosis, joint pain and abnormal screening tests with elevated ANA with high titer. He is currently taking prednisone with improvement of his symptoms. Overall, I felt that the leukocytosis is reactive in nature, likely to some autoimmune conditions or known infection or environmental exposure of unknown chemicals I do not feel strongly to redraw his blood work now as he would be inaccurate in the setting of prednisone therapy. 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 I gave him a letter  to take time off work and plan to retest his blood work at the end of the month. We  also discussed potential referral to [facility] for evaluation and he agreed with the plan but would like to defer that until next month which I think is acceptable.  Anemia of chronic illness This is likely anemia of chronic disease. The patient denies recent history of bleeding such as epistaxis, hematuria or hematochezia. He is asymptomatic from the anemia. We will observe for now.  He does not require transfusion now.    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 Observation only for now.  Positive ANA (antinuclear antibody) He has elevated ANA, joint pain and weight loss. A lot of his symptoms have improved on prednisone. He has seen 2 different rheumatologist and both of them told him that he does not have lupus or any conclusive connective tissue disorder. The patient's mother wants a second opinion at [facility] and I will refer him there next month.

## 2015-10-06 NOTE — Assessment & Plan Note (Signed)
He has elevated ANA, joint pain and weight loss. A lot of his symptoms have improved on prednisone. He has seen 2 different rheumatologist and both of them told him that he does not have lupus or any conclusive connective tissue disorder. The patient's mother wants a second opinion at [facility] and I will refer him there next month.

## 2015-10-09 ENCOUNTER — Telehealth: Payer: Self-pay | Admitting: *Deleted

## 2015-10-09 NOTE — Telephone Encounter (Signed)
[facility] phone [phone] for Second opinion to Rheumatology.   [facility] Patient [phone].  Faxed Dr. Calton Dach office note to fax [phone].   If referral is accepted they will call pt to schedule appointment.  If referral not accepted they will contact our office to let us know.

## 2015-10-12 ENCOUNTER — Ambulatory Visit: Payer: 59 | Admitting: Osteopathic Medicine

## 2015-10-12 NOTE — Progress Notes (Signed)
NO SHOW

## 2015-10-19 ENCOUNTER — Encounter: Payer: Self-pay | Admitting: Osteopathic Medicine

## 2015-10-19 DIAGNOSIS — M169 Osteoarthritis of hip, unspecified: Secondary | ICD-10-CM | POA: Insufficient documentation

## 2015-10-28 ENCOUNTER — Emergency Department (HOSPITAL_COMMUNITY)
Admission: EM | Admit: 2015-10-28 | Discharge: 2015-10-28 | Disposition: A | Discharge status: Home / Self Care | Payer: 59 | Attending: Emergency Medicine | Admitting: Emergency Medicine

## 2015-10-28 ENCOUNTER — Encounter (HOSPITAL_COMMUNITY): Payer: Self-pay | Admitting: *Deleted

## 2015-10-28 ENCOUNTER — Emergency Department (HOSPITAL_COMMUNITY): Payer: 59

## 2015-10-28 DIAGNOSIS — D473 Essential (hemorrhagic) thrombocythemia: Secondary | ICD-10-CM

## 2015-10-28 DIAGNOSIS — M2550 Pain in unspecified joint: Secondary | ICD-10-CM | POA: Diagnosis not present

## 2015-10-28 DIAGNOSIS — R700 Elevated erythrocyte sedimentation rate: Secondary | ICD-10-CM

## 2015-10-28 DIAGNOSIS — R630 Anorexia: Secondary | ICD-10-CM

## 2015-10-28 DIAGNOSIS — R590 Localized enlarged lymph nodes: Secondary | ICD-10-CM | POA: Diagnosis not present

## 2015-10-28 DIAGNOSIS — R7989 Other specified abnormal findings of blood chemistry: Secondary | ICD-10-CM

## 2015-10-28 DIAGNOSIS — R634 Abnormal weight loss: Secondary | ICD-10-CM

## 2015-10-28 DIAGNOSIS — Z7952 Long term (current) use of systemic steroids: Secondary | ICD-10-CM | POA: Insufficient documentation

## 2015-10-28 DIAGNOSIS — R911 Solitary pulmonary nodule: Secondary | ICD-10-CM | POA: Insufficient documentation

## 2015-10-28 DIAGNOSIS — A689 Relapsing fever, unspecified: Secondary | ICD-10-CM

## 2015-10-28 DIAGNOSIS — R040 Epistaxis: Secondary | ICD-10-CM | POA: Insufficient documentation

## 2015-10-28 DIAGNOSIS — R000 Tachycardia, unspecified: Secondary | ICD-10-CM | POA: Insufficient documentation

## 2015-10-28 DIAGNOSIS — R509 Fever, unspecified: Secondary | ICD-10-CM | POA: Diagnosis present

## 2015-10-28 DIAGNOSIS — D72829 Elevated white blood cell count, unspecified: Secondary | ICD-10-CM | POA: Diagnosis not present

## 2015-10-28 DIAGNOSIS — R918 Other nonspecific abnormal finding of lung field: Secondary | ICD-10-CM

## 2015-10-28 DIAGNOSIS — R768 Other specified abnormal immunological findings in serum: Secondary | ICD-10-CM

## 2015-10-28 DIAGNOSIS — R110 Nausea: Secondary | ICD-10-CM | POA: Diagnosis not present

## 2015-10-28 DIAGNOSIS — D75839 Thrombocytosis, unspecified: Secondary | ICD-10-CM

## 2015-10-28 LAB — URINALYSIS, ROUTINE W REFLEX MICROSCOPIC
Bilirubin Urine: NEGATIVE
GLUCOSE, UA: NEGATIVE mg/dL
HGB URINE DIPSTICK: NEGATIVE
Ketones, ur: NEGATIVE mg/dL
Leukocytes, UA: NEGATIVE
Nitrite: NEGATIVE
Protein, ur: NEGATIVE mg/dL
SPECIFIC GRAVITY, URINE: 1.013 (ref 1.005–1.030)
pH: 7 (ref 5.0–8.0)

## 2015-10-28 LAB — COMPREHENSIVE METABOLIC PANEL
ALBUMIN: 4.2 g/dL (ref 3.5–5.0)
ALT: 10 U/L — AB (ref 17–63)
AST: 16 U/L (ref 15–41)
Alkaline Phosphatase: 93 U/L (ref 38–126)
Anion gap: 13 (ref 5–15)
BILIRUBIN TOTAL: 0.5 mg/dL (ref 0.3–1.2)
BUN: 11 mg/dL (ref 6–20)
CHLORIDE: 97 mmol/L — AB (ref 101–111)
CO2: 25 mmol/L (ref 22–32)
CREATININE: 0.92 mg/dL (ref 0.61–1.24)
Calcium: 9.7 mg/dL (ref 8.9–10.3)
GFR calc Af Amer: >60 mL/min (ref >60)
GFR calc non Af Amer: >60 mL/min (ref >60)
GLUCOSE: 92 mg/dL (ref 65–99)
POTASSIUM: 4.1 mmol/L (ref 3.5–5.1)
Sodium: 135 mmol/L (ref 135–145)
Total Protein: 9 g/dL — ABNORMAL HIGH (ref 6.5–8.1)

## 2015-10-28 LAB — CBC WITH DIFFERENTIAL/PLATELET
BASOS ABS: 0 10*3/uL (ref 0.0–0.1)
BASOS PCT: 0 %
Eosinophils Absolute: 0 10*3/uL (ref 0.0–0.7)
Eosinophils Relative: 0 %
HEMATOCRIT: 37 % — AB (ref 39.0–52.0)
Hemoglobin: 11.9 g/dL — ABNORMAL LOW (ref 13.0–17.0)
LYMPHS PCT: 10 %
Lymphs Abs: 1.9 10*3/uL (ref 0.7–4.0)
MCH: 25.8 pg — ABNORMAL LOW (ref 26.0–34.0)
MCHC: 32.2 g/dL (ref 30.0–36.0)
MCV: 80.3 fL (ref 78.0–100.0)
MONO ABS: 1.1 10*3/uL — AB (ref 0.1–1.0)
Monocytes Relative: 6 %
NEUTROS ABS: 15.7 10*3/uL — AB (ref 1.7–7.7)
NEUTROS PCT: 84 %
PLATELETS: 651 10*3/uL — AB (ref 150–400)
RBC: 4.61 MIL/uL (ref 4.22–5.81)
RDW: 14.3 % (ref 11.5–15.5)
WBC: 18.7 10*3/uL — AB (ref 4.0–10.5)

## 2015-10-28 LAB — SAVE SMEAR

## 2015-10-28 LAB — SEDIMENTATION RATE: Sed Rate: 83 mm/hr — ABNORMAL HIGH (ref 0–16)

## 2015-10-28 MED ORDER — IOHEXOL 300 MG/ML  SOLN
25.0000 mL | Freq: Once | INTRAMUSCULAR | Status: AC | PRN
  Administered 2015-10-28: 25 mL via ORAL

## 2015-10-28 MED ORDER — PREDNISONE 10 MG PO TABS
10.0000 mg | ORAL_TABLET | Freq: Every day | ORAL | Status: AC
Start: 2015-10-28 — End: ?

## 2015-10-28 MED ORDER — ONDANSETRON HCL 4 MG/2ML IJ SOLN
4.0000 mg | Freq: Once | INTRAMUSCULAR | Status: AC
  Administered 2015-10-28: 4 mg via INTRAVENOUS
  Filled 2015-10-28: qty 2

## 2015-10-28 MED ORDER — IOHEXOL 300 MG/ML  SOLN
100.0000 mL | Freq: Once | INTRAMUSCULAR | Status: AC | PRN
  Administered 2015-10-28: 100 mL via INTRAVENOUS

## 2015-10-28 MED ORDER — SODIUM CHLORIDE 0.9 % IV BOLUS (SEPSIS)
1000.0000 mL | Freq: Once | INTRAVENOUS | Status: AC
  Administered 2015-10-28: 1000 mL via INTRAVENOUS

## 2015-10-28 MED ORDER — PREDNISONE 20 MG PO TABS
10.0000 mg | ORAL_TABLET | Freq: Once | ORAL | Status: AC
  Administered 2015-10-28: 10 mg via ORAL
  Filled 2015-10-28: qty 1

## 2015-10-28 NOTE — Discharge Instructions (Signed)
It was our pleasure to provide your ER care today - we hope that you feel better.  Rest. Drink plenty of fluids.  Take prednisone as prescribed.  Follow up with your hematologist this Monday as planned - have them review all test results, and discuss possible biopsy.  Your ct scan was read as showing (follow up with your doctor):  IMPRESSION: Bilateral axillary adenopathy, nonspecific. Recommend clinical and laboratory correlation. Findings may be infectious or inflammatory in etiology. Malignancy (lymphoma) is not favored however not excluded.  There a few tiny pulmonary nodules, measuring up to 4 mm, likely infectious or inflammatory in etiology.  Return to ER if worse, new symptoms, persistent vomiting, trouble breathing, other concern.

## 2015-10-28 NOTE — ED Notes (Signed)
Family reports that pt have been going through these sxs since thanksgiving last year.   Reports joint pain with fever.  Started to have n/v today.  She states that they have been to the ED 3 times for this and once at [facility].  His doctor had referred him to a Hematologist and had some blood drawn and was found to have elevated WBC.  She states that the Rheumatologist did not think it's RA.  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

## 2015-10-28 NOTE — ED Provider Notes (Addendum)
CSN: [id number]     Arrival date & time 10/28/15  1519 History   First MD Initiated Contact with Patient 10/28/15 1552     Chief Complaint  Patient presents with  . Fever  . Joint Pain     (Consider location/radiation/quality/duration/timing/severity/associated sxs/prior Treatment) Patient is a 20 y.o. male presenting with fever. The history is provided by the patient and a parent.  Fever Associated symptoms: no chest pain, no confusion, no cough, no diarrhea, no dysuria, no headaches, no rash, no sore throat and no vomiting   Patient presents w approximately 5 month history weight loss (30-40 lbs), intermittent fevers (randon, not daily or nightly, 4-5 times since onset symptoms), migratory joint pain (primary bilateral shoulders and hips).  States his symptoms began with joint pain, initially in left shoulder, and then moving to right shoulder, and bil hips.   There was no preceding febrile illness.  No preceding rash.  Patient denies  night sweats. No rash throughout course illness (other than mild facial/back 'acne').  No rash associated w periods fevers. No oral lesions.  Denies headache. No neck or back pain. No cough, sore throat, mouth sores, or uri c/o. No swollen glands/lymph nodes.  No chest pain or sob. No abd pain. Intermittent nausea, and persistently decreased appetite. +generalized weakness/fatigue.  Pt had improved in the past month, during 3-4 week period of steroid tx/taper.  During that time his weight was stable, and had improved appetite. States w pred tx and occasional nsaid use, his joint pain had resolved.   Has been off prednisone x 1 week, and now fatigue/gen weakness have returned/worse. No current focal joint pain. Return of fever in past day. +chills. Nausea this AM.    Pt reports has been kept out of work for the past 3 weeks, in event some type of work exposure was related to symptoms, but symptoms persist this week.   No hx ivda.  In November, tx for possible uti - pt  denies any urethral d/c at that time, no hx std. u cx from then negative. It appears no gc/ch swab sent.  Urinary symptoms completely resolved then and have not recurred.   Work up today, by [employer], heme/onc Alvy Bimler), rheumatology, has been significant for +ANA, +ANCA, elevated ESR/CRP, leucocytosis (20's), thrombocytosis (600s), high ferritin (972).  Pt was told by rheumatology that it was not RA/JIA/SLE.        History reviewed. No pertinent past medical history. Past Surgical History  Procedure Laterality Date  . Wisdom tooth extraction     No family history on file. Social History  Substance Use Topics  . Smoking status: Never Smoker   . Smokeless tobacco: Never Used  . Alcohol Use: No    Review of Systems  Constitutional: Positive for fever.  HENT: Negative for sore throat.        Had minor nosebleed in ED (notes rare recent prior nosebleed as well)  Eyes: Negative for pain and redness.  Respiratory: Negative for cough and shortness of breath.   Cardiovascular: Negative for chest pain and leg swelling.  Gastrointestinal: Negative for vomiting, abdominal pain and diarrhea.  Endocrine: Negative for polyuria.  Genitourinary: Negative for dysuria, flank pain and discharge.  Musculoskeletal: Negative for back pain and neck pain.  Skin: Negative for rash.  Neurological: Negative for weakness, numbness and headaches.  Hematological: Does not bruise/bleed easily.  Psychiatric/Behavioral: Negative for confusion.      Allergies  Review of patient's allergies indicates no known allergies.  Home Medications   Prior to Admission medications   Medication Sig Start Date End Date Taking? Authorizing Provider  predniSONE (DELTASONE) 5 MG tablet Take 10 mg by mouth daily.    Historical Provider, MD   BP 114/78 mmHg  Pulse 135  Temp(Src) 100.4 F (38 C) (Oral)  Resp 22  Wt 72.621 kg  SpO2 97% Physical Exam  Constitutional: He is oriented to person, place, and time. No  distress.  Thin appearing, low grade fever, tachy.   HENT:  Head: Atraumatic.  Mouth/Throat: Oropharynx is clear and moist.  No oral lesions/ulcers  Eyes: Conjunctivae are normal. Pupils are equal, round, and reactive to light. No scleral icterus.  Neck: Neck supple. No tracheal deviation present. No thyromegaly present.  No thyroid mass or tenderness.   Cardiovascular: Regular rhythm, normal heart sounds and intact distal pulses.  Exam reveals no gallop and no friction rub.   No murmur heard. tachycardic  Pulmonary/Chest: Effort normal and breath sounds normal. No accessory muscle usage. No respiratory distress.  Abdominal: Soft. Bowel sounds are normal. He exhibits no distension and no mass. There is no tenderness. There is no rebound and no guarding.  No gross hsm.   Genitourinary:  No cva tenderness  Musculoskeletal: Normal range of motion. He exhibits no edema or tenderness.  No focal joint swelling, pain or erythema. Distal pulses palp bil. No gross l/a felt. CTLS spine, non tender, aligned, no step off.   Lymphadenopathy:    He has no cervical adenopathy.  Neurological: He is alert and oriented to person, place, and time.  Steady gait.   Skin: Skin is warm and dry. No rash noted. He is not diaphoretic.  White lines across nail beds (?[place]) - pt/parent indicates have been there for years, long before any current symptoms.   Psychiatric: He has a normal mood and affect.  Nursing note and vitals reviewed.   ED Course  Procedures (including critical care time) Labs Review   Results for orders placed or performed during the hospital encounter of 10/28/15  CBC with Differential/Platelet  Result Value Ref Range   WBC 18.7 (H) 4.0 - 10.5 K/uL   RBC 4.61 4.22 - 5.81 MIL/uL   Hemoglobin 11.9 (L) 13.0 - 17.0 g/dL   HCT 37.0 (L) 39.0 - 52.0 %   MCV 80.3 78.0 - 100.0 fL   MCH 25.8 (L) 26.0 - 34.0 pg   MCHC 32.2 30.0 - 36.0 g/dL   RDW 14.3 11.5 - 15.5 %   Platelets 651  (H) 150 - 400 K/uL   Neutrophils Relative % 84 %   Neutro Abs 15.7 (H) 1.7 - 7.7 K/uL   Lymphocytes Relative 10 %   Lymphs Abs 1.9 0.7 - 4.0 K/uL   Monocytes Relative 6 %   Monocytes Absolute 1.1 (H) 0.1 - 1.0 K/uL   Eosinophils Relative 0 %   Eosinophils Absolute 0.0 0.0 - 0.7 K/uL   Basophils Relative 0 %   Basophils Absolute 0.0 0.0 - 0.1 K/uL  Comprehensive metabolic panel  Result Value Ref Range   Sodium 135 135 - 145 mmol/L   Potassium 4.1 3.5 - 5.1 mmol/L   Chloride 97 (L) 101 - 111 mmol/L   CO2 25 22 - 32 mmol/L   Glucose, Bld 92 65 - 99 mg/dL   BUN 11 6 - 20 mg/dL   Creatinine, Ser 0.92 0.61 - 1.24 mg/dL   Calcium 9.7 8.9 - 10.3 mg/dL   Total Protein 9.0 (H) 6.5 -  8.1 g/dL   Albumin 4.2 3.5 - 5.0 g/dL   AST 16 15 - 41 U/L   ALT 10 (L) 17 - 63 U/L   Alkaline Phosphatase 93 38 - 126 U/L   Total Bilirubin 0.5 0.3 - 1.2 mg/dL   GFR calc non Af Amer >60 >60 mL/min   GFR calc Af Amer >60 >60 mL/min   Anion gap 13 5 - 15  Urinalysis, Routine w reflex microscopic (not at [facility])  Result Value Ref Range   Color, Urine YELLOW YELLOW   APPearance CLOUDY (A) CLEAR   Specific Gravity, Urine 1.013 1.005 - 1.030   pH 7.0 5.0 - 8.0   Glucose, UA NEGATIVE NEGATIVE mg/dL   Hgb urine dipstick NEGATIVE NEGATIVE   Bilirubin Urine NEGATIVE NEGATIVE   Ketones, ur NEGATIVE NEGATIVE mg/dL   Protein, ur NEGATIVE NEGATIVE mg/dL   Nitrite NEGATIVE NEGATIVE   Leukocytes, UA NEGATIVE NEGATIVE  Sedimentation rate  Result Value Ref Range   Sed Rate 83 (H) 0 - 16 mm/hr  Save smear  Result Value Ref Range   Smear Review SMEAR STAINED AND AVAILABLE FOR REVIEW    Ct Chest W Contrast  10/28/2015  CLINICAL DATA:  Patient with 5 month history of weight loss and intermittent fevers. Migratory joint pain. Intermittent nausea and decreased appetite. EXAM: CT CHEST, ABDOMEN AND PELVIS WITHOUT CONTRAST TECHNIQUE: Multidetector CT imaging of the chest, abdomen and pelvis was performed following the  standard protocol without IV contrast. COMPARISON:  None. FINDINGS: CT CHEST FINDINGS Mediastinum/Lymph Nodes: Visualized thyroid is unremarkable. Enlarged bilateral axillary lymphadenopathy with a reference right axillary lymph node measuring 1.6 cm (image 13; series 2) and a reference left axillary lymph node measuring 1.7 cm (image 13; series 2). The heart is normal in size. No pericardial effusion. Aorta and main pulmonary artery are normal in caliber. No mediastinal or hilar lymphadenopathy. Esophagus is grossly unremarkable. Lungs/Pleura: The central airways are patent. No large area of pulmonary consolidation. There is a 3 mm subpleural nodule within the left lower lobe (image 53; series 7). There is a 4 mm subpleural nodule within the right lower lobe (image 45; series 7). No pleural effusion or pneumothorax. Musculoskeletal: No aggressive or acute appearing osseous lesions. CT ABDOMEN PELVIS FINDINGS Hepatobiliary: Liver is normal in size and contour. No focal hepatic lesion is identified. Gallbladder is unremarkable. Pancreas: Unremarkable Spleen: Unremarkable Adrenals/Urinary Tract: The adrenal glands are normal. Kidneys are symmetric in size. No hydronephrosis. The urinary bladder is unremarkable. Stomach/Bowel: No abnormal bowel wall thickening or evidence for bowel obstruction. No free fluid or free intraperitoneal air. Vascular/Lymphatic: Normal caliber abdominal aorta. There is a 1.0 cm left external iliac lymph node (image 116; series 2). Other: Prostate unremarkable. Musculoskeletal: Mild degenerative changes of the hip joints bilaterally. No aggressive or acute appearing osseous lesions. IMPRESSION: Bilateral axillary adenopathy, nonspecific. Recommend clinical and laboratory correlation. Findings may be infectious or inflammatory in etiology. Malignancy (lymphoma) is not favored however not excluded. There a few tiny pulmonary nodules, measuring up to 4 mm, likely infectious or inflammatory in  etiology. Electronically Signed   By: Lovey Newcomer M.D.   On: 10/28/2015 19:51   Ct Abdomen Pelvis W Contrast  10/28/2015  CLINICAL DATA:  Patient with 5 month history of weight loss and intermittent fevers. Migratory joint pain. Intermittent nausea and decreased appetite. EXAM: CT CHEST, ABDOMEN AND PELVIS WITHOUT CONTRAST TECHNIQUE: Multidetector CT imaging of the chest, abdomen and pelvis was performed following the standard protocol without IV  contrast. COMPARISON:  None. FINDINGS: CT CHEST FINDINGS Mediastinum/Lymph Nodes: Visualized thyroid is unremarkable. Enlarged bilateral axillary lymphadenopathy with a reference right axillary lymph node measuring 1.6 cm (image 13; series 2) and a reference left axillary lymph node measuring 1.7 cm (image 13; series 2). The heart is normal in size. No pericardial effusion. Aorta and main pulmonary artery are normal in caliber. No mediastinal or hilar lymphadenopathy. Esophagus is grossly unremarkable. Lungs/Pleura: The central airways are patent. No large area of pulmonary consolidation. There is a 3 mm subpleural nodule within the left lower lobe (image 53; series 7). There is a 4 mm subpleural nodule within the right lower lobe (image 45; series 7). No pleural effusion or pneumothorax. Musculoskeletal: No aggressive or acute appearing osseous lesions. CT ABDOMEN PELVIS FINDINGS Hepatobiliary: Liver is normal in size and contour. No focal hepatic lesion is identified. Gallbladder is unremarkable. Pancreas: Unremarkable Spleen: Unremarkable Adrenals/Urinary Tract: The adrenal glands are normal. Kidneys are symmetric in size. No hydronephrosis. The urinary bladder is unremarkable. Stomach/Bowel: No abnormal bowel wall thickening or evidence for bowel obstruction. No free fluid or free intraperitoneal air. Vascular/Lymphatic: Normal caliber abdominal aorta. There is a 1.0 cm left external iliac lymph node (image 116; series 2). Other: Prostate unremarkable.  Musculoskeletal: Mild degenerative changes of the hip joints bilaterally. No aggressive or acute appearing osseous lesions. IMPRESSION: Bilateral axillary adenopathy, nonspecific. Recommend clinical and laboratory correlation. Findings may be infectious or inflammatory in etiology. Malignancy (lymphoma) is not favored however not excluded. There a few tiny pulmonary nodules, measuring up to 4 mm, likely infectious or inflammatory in etiology. Electronically Signed   By: Lovey Newcomer M.D.   On: 10/28/2015 19:51      I have personally reviewed and evaluated these images and lab results as part of my medical decision-making.    MDM   Iv ns bolus. zofran iv.  Discussed diff dx including lymphoma/malig, autoimmune dis, rheumatologic dis, vasculitis, granulomatous, infectious (atypical, myco, endocard, other occult process).   Hem/onc consulted, as noted to have labs pending in computer and has f/u appt with Dr Alvy Bimler this week.  Discussed case with Dr Marjie Skiff - he indicates to go ahead and send those labs today, and have f/u this week as planned.   He indicates as pt improved while on prednisone, could re-start at 10 mg a day.  He also suggested ID consult.  ID consulted.  Discussed pt with Dr Linus Salmons - indicates no new recs, feels likely not infectious. rec rheum/hem f/u.  Labs sent.   Iv ns bolus.  Recheck feels improved. Ate meal. Drinking fluids.  Ct resulted.  Discussed w pt/fam.   Discussed w rheumatologist on call at [facility], including labs, ct, clinical course - indicates from phone discussion, no definitive dx clear - he indicates could refer to [facility] rheum for outpt f/u if wants to see them/2nd opinion.   Agrees w hem f/u and pursuit of outpt bx.   As pt to see heme Monday, called back to hem/onc on call, to inform of ct, and so that bx l/n could be added to workup then. Dr Jana Hakim indicates he will correspond w Dr Alvy Bimler, who will see pt Monday, f/u on labs, and arrange biopsy  (?axillary l/n +/- Bone marrow).  Recheck, vitals normal. No distress. Pt/fam agreeable w plan.  Pt currently appears stable for d/c.         Lajean Saver, MD 10/28/15 2226

## 2015-10-28 NOTE — ED Notes (Signed)
MD at bedside. 

## 2015-10-29 ENCOUNTER — Other Ambulatory Visit: Payer: Self-pay | Admitting: Oncology

## 2015-10-29 LAB — FERRITIN: FERRITIN: 515 ng/mL — AB (ref 24–336)

## 2015-10-30 ENCOUNTER — Telehealth: Payer: Self-pay | Admitting: Hematology and Oncology

## 2015-10-30 ENCOUNTER — Ambulatory Visit (HOSPITAL_BASED_OUTPATIENT_CLINIC_OR_DEPARTMENT_OTHER): Payer: PRIVATE HEALTH INSURANCE | Admitting: Hematology and Oncology

## 2015-10-30 ENCOUNTER — Encounter: Payer: Self-pay | Admitting: Hematology and Oncology

## 2015-10-30 ENCOUNTER — Telehealth: Payer: Self-pay | Admitting: *Deleted

## 2015-10-30 ENCOUNTER — Other Ambulatory Visit: Payer: 59

## 2015-10-30 VITALS — BP 130/79 | HR 95 | Temp 98.1°F | Resp 18 | Ht 77.0 in | Wt 161.0 lb

## 2015-10-30 DIAGNOSIS — D473 Essential (hemorrhagic) thrombocythemia: Secondary | ICD-10-CM

## 2015-10-30 DIAGNOSIS — M2550 Pain in unspecified joint: Secondary | ICD-10-CM

## 2015-10-30 DIAGNOSIS — D638 Anemia in other chronic diseases classified elsewhere: Secondary | ICD-10-CM

## 2015-10-30 DIAGNOSIS — R599 Enlarged lymph nodes, unspecified: Secondary | ICD-10-CM | POA: Diagnosis not present

## 2015-10-30 DIAGNOSIS — D72829 Elevated white blood cell count, unspecified: Secondary | ICD-10-CM | POA: Diagnosis not present

## 2015-10-30 DIAGNOSIS — D75838 Other thrombocytosis: Secondary | ICD-10-CM

## 2015-10-30 DIAGNOSIS — R590 Localized enlarged lymph nodes: Secondary | ICD-10-CM

## 2015-10-30 DIAGNOSIS — R7989 Other specified abnormal findings of blood chemistry: Secondary | ICD-10-CM

## 2015-10-30 NOTE — Telephone Encounter (Signed)
Pt confirmed MD visit per 01/30 POF, gave pt AVS and Calendar... KJ

## 2015-10-30 NOTE — Assessment & Plan Note (Signed)
He has bizarre constellation of leukocytosis, anemia, thrombocytosis, joint pain and abnormal screening tests with elevated ANA with high titer. He is currently taking prednisone with improvement of his symptoms. 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 We also discussed potential referral to [facility] for evaluation and he agreed with the plan but would like to defer that until next month which I think is acceptable. He has peripheral blood for JAK2 mutation and BCR/ABL drawn to exclude myeloproliferative disorder, which is unlikely. There is no benefit to do bone marrow aspirate and biopsy now I recommend hematology consultation [facility] and he agrees with the plan of care

## 2015-10-30 NOTE — Assessment & Plan Note (Signed)
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 His symptoms improved with low dose prednisone. He would like to stay on prednisone until his appointment to [facility] which I think is acceptable. Once his prescription runs out, I recommend him to take ibuprofen as needed

## 2015-10-30 NOTE — Assessment & Plan Note (Signed)
This is likely anemia of chronic disease. The patient denies recent history of bleeding such as epistaxis, hematuria or hematochezia. He is asymptomatic from the anemia. We will observe for now.  

## 2015-10-30 NOTE — Progress Notes (Signed)
[facility] OFFICE PROGRESS NOTE  Emeterio Reeve, DO SUMMARY OF HEMATOLOGIC HISTORY:  Leukocytosis, anemia, reactive thrombocytosis, elevated ANA, joint pain and weight loss  HISTORY OF PRESENTING ILLNESS:  Seth Black 20 y.o. male is here because of elevated WBC. 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 His occupation started around August 2015. Over the past 5 months, he complained of significant bilateral shoulder pain and hip pain with associated early morning stiffness. His mother disclosed history of Lyme disease in 2010 and some viral infection. 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 He has 2 dogs at home and he has been camping outdoor but he does not recall any recent tick bites. He denies any sick contact. There were no family history of autoimmune disorders. Over the past few months, he had progressive anorexia and 40 pound weight loss. He has been through multiple physicians with significant visits including emergency room visits, referral to [facility] and had seen 2 different rheumatologist. He was placed on prednisone taper about 2 weeks ago and has symptomatic improvement with 10 pound weight gain since then and less joint pain. He complained of some low-grade fevers recently. He has been tested for Lyme disease and was told he does not have obvious infection. He was noted to have elevated ANA. HLA B27 was negative. He denies recent infection. The last prescription antibiotics was more than 3 months ago There is not reported symptoms of sinus congestion, cough, urinary frequency/urgency or dysuria, diarrhea, or abnormal skin rash.  He had no prior  history or diagnosis of cancer. His age appropriate screening programs are up-to-date. The patient is not a smoker  The patient denies any recent signs or symptoms of bleeding such as spontaneous epistaxis, hematuria or hematochezia. The patient was tapered off prednisone by early January but was restarted since 10/28/2015 due to recurrence of joint pain. INTERVAL HISTORY: Seth Black 20 y.o. male returns for further follow-up. He felt better since he restarted prednisone 10 mg daily. His joint pain is bearable. He denies further nausea, vomiting or abdomen pain. He has diffuse lymphadenopathy but they do not bother him. He denies recurrence of fever.  I have reviewed the past medical history, past surgical history, social history and family history with the patient and they are unchanged from previous note.  ALLERGIES:  has No Known Allergies.  MEDICATIONS:  Current Outpatient Prescriptions  Medication Sig Dispense Refill  . ALPRAZOLAM PO Take 2 tablets by mouth at bedtime as needed (sleep).    Marland Kitchen ibuprofen (ADVIL,MOTRIN) 200 MG tablet Take 600-800 mg by mouth 2 (two) times daily as needed for moderate pain.    . naproxen sodium (ANAPROX) 220 MG tablet Take 440 mg by mouth daily as needed (pain).    . ondansetron (ZOFRAN-ODT) 8 MG disintegrating tablet Take 8 mg by mouth daily as needed for nausea.   3  . predniSONE (DELTASONE) 10 MG tablet Take 1 tablet (10 mg total) by mouth daily. 10 tablet 0   No current facility-administered medications for this visit.     REVIEW OF SYSTEMS:   Eyes: Denies blurriness of vision Ears, nose, mouth, throat, and face: Denies mucositis or sore throat Respiratory: Denies cough,  dyspnea or wheezes Cardiovascular: Denies palpitation, chest discomfort or lower extremity swelling Skin: Denies abnormal skin rashes Neurological:Denies numbness, tingling or new weaknesses Behavioral/Psych: Mood is stable, no new changes  All other systems were  reviewed with the patient and are negative.  PHYSICAL EXAMINATION: ECOG PERFORMANCE STATUS: 1 - Symptomatic but completely ambulatory  Filed Vitals:   10/30/15 1009  BP: 130/79  Pulse: 95  Temp: 98.1 F (36.7 C)  Resp: 18   Filed Weights   10/30/15 1009  Weight: 161 lb (73.029 kg)    GENERAL:alert, no distress and comfortable SKIN: skin color, texture, turgor are normal, no rashes or significant lesions EYES: normal, Conjunctiva are pink and non-injected, sclera clear OROPHARYNX:no exudate, no erythema and lips, buccal mucosa, and tongue normal  NECK: supple, thyroid normal size, non-tender, without nodularity LYMPH:  He has palpable lymphadenopathy in the cervical region and the left axilla LUNGS: clear to auscultation and percussion with normal breathing effort HEART: regular rate & rhythm and no murmurs and no lower extremity edema ABDOMEN:abdomen soft, non-tender and normal bowel sounds Musculoskeletal:no cyanosis of digits and no clubbing  NEURO: alert & oriented x 3 with fluent speech, no focal motor/sensory deficits  LABORATORY DATA:  I have reviewed the data as listed Results for orders placed or performed during the hospital encounter of 10/28/15 (from the past 48 hour(s))  Ferritin (Iron Binding Protein)     Status: Abnormal   Collection Time: 10/28/15  6:10 PM  Result Value Ref Range   Ferritin 515 (H) 24 - 336 ng/mL    Comment: Performed at [facility]  Save smear     Status: None   Collection Time: 10/28/15  6:10 PM  Result Value Ref Range   Smear Review SMEAR STAINED AND AVAILABLE FOR REVIEW   CBC with Differential/Platelet     Status: Abnormal   Collection Time: 10/28/15  6:15 PM  Result Value Ref Range   WBC 18.7 (H) 4.0 - 10.5 K/uL   RBC 4.61 4.22 - 5.81 MIL/uL   Hemoglobin 11.9 (L) 13.0 - 17.0 g/dL   HCT 37.0 (L) 39.0 - 52.0 %   MCV 80.3 78.0 - 100.0 fL   MCH 25.8 (L) 26.0 - 34.0 pg   MCHC 32.2 30.0 - 36.0 g/dL   RDW 14.3 11.5 - 15.5 %    Platelets 651 (H) 150 - 400 K/uL   Neutrophils Relative % 84 %   Neutro Abs 15.7 (H) 1.7 - 7.7 K/uL   Lymphocytes Relative 10 %   Lymphs Abs 1.9 0.7 - 4.0 K/uL   Monocytes Relative 6 %   Monocytes Absolute 1.1 (H) 0.1 - 1.0 K/uL   Eosinophils Relative 0 %   Eosinophils Absolute 0.0 0.0 - 0.7 K/uL   Basophils Relative 0 %   Basophils Absolute 0.0 0.0 - 0.1 K/uL  Comprehensive metabolic panel     Status: Abnormal   Collection Time: 10/28/15  6:15 PM  Result Value Ref Range   Sodium 135 135 - 145 mmol/L   Potassium 4.1 3.5 - 5.1 mmol/L   Chloride 97 (L) 101 - 111 mmol/L   CO2 25 22 - 32 mmol/L   Glucose, Bld 92 65 - 99 mg/dL   BUN 11 6 - 20 mg/dL   Creatinine, Ser 0.92 0.61 - 1.24 mg/dL   Calcium 9.7 8.9 - 10.3 mg/dL   Total Protein 9.0 (H) 6.5 - 8.1 g/dL   Albumin 4.2 3.5 - 5.0 g/dL   AST 16 15 -  41 U/L   ALT 10 (L) 17 - 63 U/L   Alkaline Phosphatase 93 38 - 126 U/L   Total Bilirubin 0.5 0.3 - 1.2 mg/dL   GFR calc non Af Amer >60 >60 mL/min   GFR calc Af Amer >60 >60 mL/min    Comment: (NOTE) The eGFR has been calculated using the CKD EPI equation. This calculation has not been validated in all clinical situations. eGFR's persistently <60 mL/min signify possible Chronic Kidney Disease.    Anion gap 13 5 - 15  Sedimentation rate     Status: Abnormal   Collection Time: 10/28/15  6:15 PM  Result Value Ref Range   Sed Rate 83 (H) 0 - 16 mm/hr  Blood culture (routine x 2)     Status: None (Preliminary result)   Collection Time: 10/28/15  6:15 PM  Result Value Ref Range   Specimen Description      BLOOD RIGHT ANTECUBITAL Performed at [facility]    Special Requests BOTTLES DRAWN AEROBIC AND ANAEROBIC 5CC    Culture PENDING    Report Status PENDING   Urinalysis, Routine w reflex microscopic (not at [facility])     Status: Abnormal   Collection Time: 10/28/15  7:35 PM  Result Value Ref Range   Color, Urine YELLOW YELLOW   APPearance CLOUDY (A) CLEAR   Specific Gravity,  Urine 1.013 1.005 - 1.030   pH 7.0 5.0 - 8.0   Glucose, UA NEGATIVE NEGATIVE mg/dL   Hgb urine dipstick NEGATIVE NEGATIVE   Bilirubin Urine NEGATIVE NEGATIVE   Ketones, ur NEGATIVE NEGATIVE mg/dL   Protein, ur NEGATIVE NEGATIVE mg/dL   Nitrite NEGATIVE NEGATIVE   Leukocytes, UA NEGATIVE NEGATIVE    Comment: MICROSCOPIC NOT DONE ON URINES WITH NEGATIVE PROTEIN, BLOOD, LEUKOCYTES, NITRITE, OR GLUCOSE <1000 mg/dL.    Lab Results  Component Value Date   WBC 18.7* 10/28/2015   HGB 11.9* 10/28/2015   HCT 37.0* 10/28/2015   MCV 80.3 10/28/2015   PLT 651* 10/28/2015    RADIOGRAPHIC STUDIES: I have personally reviewed the radiological images as listed and agreed with the findings in the report. Ct Chest W Contrast  10/28/2015  CLINICAL DATA:  Patient with 5 month history of weight loss and intermittent fevers. Migratory joint pain. Intermittent nausea and decreased appetite. EXAM: CT CHEST, ABDOMEN AND PELVIS WITHOUT CONTRAST TECHNIQUE: Multidetector CT imaging of the chest, abdomen and pelvis was performed following the standard protocol without IV contrast. COMPARISON:  None. FINDINGS: CT CHEST FINDINGS Mediastinum/Lymph Nodes: Visualized thyroid is unremarkable. Enlarged bilateral axillary lymphadenopathy with a reference right axillary lymph node measuring 1.6 cm (image 13; series 2) and a reference left axillary lymph node measuring 1.7 cm (image 13; series 2). The heart is normal in size. No pericardial effusion. Aorta and main pulmonary artery are normal in caliber. No mediastinal or hilar lymphadenopathy. Esophagus is grossly unremarkable. Lungs/Pleura: The central airways are patent. No large area of pulmonary consolidation. There is a 3 mm subpleural nodule within the left lower lobe (image 53; series 7). There is a 4 mm subpleural nodule within the right lower lobe (image 45; series 7). No pleural effusion or pneumothorax. Musculoskeletal: No aggressive or acute appearing osseous lesions. CT  ABDOMEN PELVIS FINDINGS Hepatobiliary: Liver is normal in size and contour. No focal hepatic lesion is identified. Gallbladder is unremarkable. Pancreas: Unremarkable Spleen: Unremarkable Adrenals/Urinary Tract: The adrenal glands are normal. Kidneys are symmetric in size. No hydronephrosis. The urinary bladder is unremarkable. Stomach/Bowel: No abnormal  bowel wall thickening or evidence for bowel obstruction. No free fluid or free intraperitoneal air. Vascular/Lymphatic: Normal caliber abdominal aorta. There is a 1.0 cm left external iliac lymph node (image 116; series 2). Other: Prostate unremarkable. Musculoskeletal: Mild degenerative changes of the hip joints bilaterally. No aggressive or acute appearing osseous lesions. IMPRESSION: Bilateral axillary adenopathy, nonspecific. Recommend clinical and laboratory correlation. Findings may be infectious or inflammatory in etiology. Malignancy (lymphoma) is not favored however not excluded. There a few tiny pulmonary nodules, measuring up to 4 mm, likely infectious or inflammatory in etiology. Electronically Signed   By: Lovey Newcomer M.D.   On: 10/28/2015 19:51   Ct Abdomen Pelvis W Contrast  10/28/2015  CLINICAL DATA:  Patient with 5 month history of weight loss and intermittent fevers. Migratory joint pain. Intermittent nausea and decreased appetite. EXAM: CT CHEST, ABDOMEN AND PELVIS WITHOUT CONTRAST TECHNIQUE: Multidetector CT imaging of the chest, abdomen and pelvis was performed following the standard protocol without IV contrast. COMPARISON:  None. FINDINGS: CT CHEST FINDINGS Mediastinum/Lymph Nodes: Visualized thyroid is unremarkable. Enlarged bilateral axillary lymphadenopathy with a reference right axillary lymph node measuring 1.6 cm (image 13; series 2) and a reference left axillary lymph node measuring 1.7 cm (image 13; series 2). The heart is normal in size. No pericardial effusion. Aorta and main pulmonary artery are normal in caliber. No mediastinal  or hilar lymphadenopathy. Esophagus is grossly unremarkable. Lungs/Pleura: The central airways are patent. No large area of pulmonary consolidation. There is a 3 mm subpleural nodule within the left lower lobe (image 53; series 7). There is a 4 mm subpleural nodule within the right lower lobe (image 45; series 7). No pleural effusion or pneumothorax. Musculoskeletal: No aggressive or acute appearing osseous lesions. CT ABDOMEN PELVIS FINDINGS Hepatobiliary: Liver is normal in size and contour. No focal hepatic lesion is identified. Gallbladder is unremarkable. Pancreas: Unremarkable Spleen: Unremarkable Adrenals/Urinary Tract: The adrenal glands are normal. Kidneys are symmetric in size. No hydronephrosis. The urinary bladder is unremarkable. Stomach/Bowel: No abnormal bowel wall thickening or evidence for bowel obstruction. No free fluid or free intraperitoneal air. Vascular/Lymphatic: Normal caliber abdominal aorta. There is a 1.0 cm left external iliac lymph node (image 116; series 2). Other: Prostate unremarkable. Musculoskeletal: Mild degenerative changes of the hip joints bilaterally. No aggressive or acute appearing osseous lesions. IMPRESSION: Bilateral axillary adenopathy, nonspecific. Recommend clinical and laboratory correlation. Findings may be infectious or inflammatory in etiology. Malignancy (lymphoma) is not favored however not excluded. There a few tiny pulmonary nodules, measuring up to 4 mm, likely infectious or inflammatory in etiology. Electronically Signed   By: Lovey Newcomer M.D.   On: 10/28/2015 19:51    ASSESSMENT & PLAN:  Leukocytosis He has bizarre constellation of leukocytosis, anemia, thrombocytosis, joint pain and abnormal screening tests with elevated ANA with high titer. He is currently taking prednisone with improvement of his symptoms. 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 I gave him a letter to take time off work and plan to retest his blood work at the end of the month. We also discussed potential referral to [facility] for evaluation and he agreed with the plan but would like to defer that until next month which I  think is acceptable. He has peripheral blood for JAK2 mutation and BCR/ABL drawn to exclude myeloproliferative disorder, which is unlikely. There is no benefit to do bone marrow aspirate and biopsy now I recommend hematology consultation [facility] and he agrees with the plan of care  Anemia of chronic illness This is likely anemia of chronic disease. The patient denies recent history of bleeding such as epistaxis, hematuria or hematochezia. He is asymptomatic from the anemia. We will observe for now.   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 Observation only for now.    Lymphadenopathy of head and neck region He has diffuse lymphadenopathy which again I felt that likely reactive in nature. We discussed the benefit of doing a biopsy versus waiting until he gets to the [facility] and he agreed to wait for now.  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 His symptoms improved with low dose prednisone. He would like to stay on prednisone until his appointment to [facility] which I think is acceptable. Once his prescription runs out, I recommend him to take ibuprofen as needed   All questions were answered. The patient knows to call the clinic with any problems, questions or concerns. No barriers to learning was detected.  I spent 20 minutes counseling the patient face to face. The total time spent in the appointment was 25 minutes and more than 50% was on counseling.      Alvy Bimler, Vergene Marland, MD 1/30/201712:05 PM

## 2015-10-30 NOTE — Assessment & Plan Note (Signed)
This is likely reactive in nature. Observation only for now.

## 2015-10-30 NOTE — Assessment & Plan Note (Signed)
He has diffuse lymphadenopathy which again I felt that likely reactive in nature. We discussed the benefit of doing a biopsy versus waiting until he gets to the [facility] and he agreed to wait for now.

## 2015-10-30 NOTE — Telephone Encounter (Signed)
Appt has been changed to Dr. Romilda Garret on 2/13,  Not Dr. Andrey Farmer.

## 2015-10-30 NOTE — Telephone Encounter (Signed)
Pt has appt to see Dr. Andrey Farmer in Division of Rheumatology at [facility] on 11/13/15.  Phone [phone]. [facility] and spoke w/ Mickel Baas to request they add on "[facility] Appointment" per Dr. Calton Dach request.  Mickel Baas will send message to Dr. Romilda Garret for review and let us know if they can add pt as requested.   Called Tedra Coupe in Radiology and requested CT Scans from 1/28 be sent to Dr. Romilda Garret at [facility].

## 2015-10-31 ENCOUNTER — Telehealth: Payer: Self-pay | Admitting: *Deleted

## 2015-10-31 NOTE — Telephone Encounter (Signed)
Call from [facility] at [facility].  She reports pt added on for "Hem Diagnostic Clinic" appt per Dr. Calton Dach request on 2/15.  He has appt w/ Dr. Romilda Garret on 2/13 but there was no available appts for Hem Clinic on same day.   Pt can wait as a "checker" for hem clinic on 2/13 and he may be able to be seen there on that day or may need to wait until 2/15.   Mickel Baas will call pt w/ new appt and if he has any questions about it will direct him to contact our office.   Dr. Alvy Bimler notified.

## 2015-11-03 ENCOUNTER — Telehealth: Payer: Self-pay | Admitting: *Deleted

## 2015-11-03 LAB — CULTURE, BLOOD (ROUTINE X 2)
CULTURE: NO GROWTH
CULTURE: NO GROWTH

## 2015-11-03 NOTE — Telephone Encounter (Signed)
Mother asking if lab results are back yet?  She also asking if Hematology Diagnostic Clinic appt at [facility] has been made yet?  Informed her that tests results are not available yet.  Hopefully Dr. Alvy Bimler will have them on Monday.  She will call her when she gets results.   Also informed her of appt on 2/15 for Hem Diagnostic Clinic at [facility] and gave her the phone number of Rheumatology Division at [facility].  She verbalized understanding.

## 2015-11-04 LAB — JAK2 V617F, RFLX EXONS 12 AND 13: JAK2 Exons 12 & 13: NOT DETECTED

## 2015-11-04 LAB — BCR-ABL1, CML/ALL, PCR, QUANT

## 2015-11-06 ENCOUNTER — Telehealth: Payer: Self-pay | Admitting: Hematology and Oncology

## 2015-11-06 NOTE — Telephone Encounter (Signed)
I spoke with the patient's mother. I reviewed test results from January 28 pertaining to BCR/ABL and JAK 2 mutation test results. The purpose of those tests were to evaluate for possible myeloproliferative disorder and to exclude essential thrombocytosis and CML. All those tests came back negative. I felt that the abnormal CBC and the lymphadenopathy are reactive and not from primary hematological malignancy.

## 2015-11-13 LAB — BRUCELLA IGG, IGM

## 2015-11-20 ENCOUNTER — Encounter: Payer: Self-pay | Admitting: Hematology and Oncology

## 2015-11-20 ENCOUNTER — Ambulatory Visit (HOSPITAL_BASED_OUTPATIENT_CLINIC_OR_DEPARTMENT_OTHER): Payer: 59 | Admitting: Hematology and Oncology

## 2015-11-20 ENCOUNTER — Telehealth: Payer: Self-pay | Admitting: *Deleted

## 2015-11-20 VITALS — BP 133/66 | HR 78 | Temp 97.8°F | Resp 17 | Ht 77.0 in | Wt 160.6 lb

## 2015-11-20 DIAGNOSIS — R599 Enlarged lymph nodes, unspecified: Secondary | ICD-10-CM | POA: Diagnosis not present

## 2015-11-20 DIAGNOSIS — D72829 Elevated white blood cell count, unspecified: Secondary | ICD-10-CM | POA: Diagnosis not present

## 2015-11-20 DIAGNOSIS — D473 Essential (hemorrhagic) thrombocythemia: Secondary | ICD-10-CM

## 2015-11-20 DIAGNOSIS — R768 Other specified abnormal immunological findings in serum: Secondary | ICD-10-CM

## 2015-11-20 DIAGNOSIS — D649 Anemia, unspecified: Secondary | ICD-10-CM | POA: Diagnosis not present

## 2015-11-20 DIAGNOSIS — D75838 Other thrombocytosis: Secondary | ICD-10-CM

## 2015-11-20 DIAGNOSIS — M1990 Unspecified osteoarthritis, unspecified site: Secondary | ICD-10-CM | POA: Insufficient documentation

## 2015-11-20 DIAGNOSIS — M064 Inflammatory polyarthropathy: Secondary | ICD-10-CM

## 2015-11-20 DIAGNOSIS — R7989 Other specified abnormal findings of blood chemistry: Secondary | ICD-10-CM

## 2015-11-20 DIAGNOSIS — M1380 Other specified arthritis, unspecified site: Secondary | ICD-10-CM | POA: Insufficient documentation

## 2015-11-20 NOTE — Telephone Encounter (Signed)
Referral called to Dr Estanislado Pandy, office notes faxed

## 2015-11-20 NOTE — Progress Notes (Signed)
[facility] OFFICE PROGRESS NOTE  Emeterio Reeve, DO SUMMARY OF HEMATOLOGIC HISTORY:  Leukocytosis, anemia, reactive thrombocytosis, elevated ANA, joint pain and weight loss  HISTORY OF PRESENTING ILLNESS:  Seth Black 20 y.o. male is here because of elevated WBC. 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 His occupation started around August 2015. Over the past 5 months, he complained of significant bilateral shoulder pain and hip pain with associated early morning stiffness. His mother disclosed history of Lyme disease in 2010 and some viral infection. 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 He has 2 dogs at home and he has been camping outdoor but he does not recall any recent tick bites. He denies any sick contact. There were no family history of autoimmune disorders. Over the past few months, he had progressive anorexia and 40 pound weight loss. He has been through multiple physicians with significant visits including emergency room visits, referral to [facility] and had seen 2 different rheumatologist. He was placed on prednisone taper about 2 weeks ago and has symptomatic improvement with 10 pound weight gain since then and less joint pain. He complained of some low-grade fevers recently. He has been tested for Lyme disease and was told he does not have obvious infection. He was noted to have elevated ANA. HLA B27 was negative. He denies recent infection. The last prescription antibiotics was more than 3 months ago There is not reported symptoms of sinus congestion, cough, urinary frequency/urgency or dysuria, diarrhea, or abnormal skin rash.  He had no prior  history or diagnosis of cancer. His age appropriate screening programs are up-to-date. The patient is not a smoker  The patient denies any recent signs or symptoms of bleeding such as spontaneous epistaxis, hematuria or hematochezia. The patient was tapered off prednisone by early January but was restarted since 10/28/2015 due to recurrence of joint pain.  INTERVAL HISTORY: Seth Black 20 y.o. male returns for further follow-up He returns from [facility] after weeks of testing Apparently, he had multiple blood tests and PET CT scan and was restarted back on prednisone He was seen by hematologist, rheumatologist, infectious disease and GI for multiple evaluation He feels better once he is back on prednisone He denies side effects from prednisone His appetite is stable I have reviewed the past medical history, past surgical history, social history and family history with the patient and they are unchanged from previous note.  ALLERGIES:  has No Known Allergies.  MEDICATIONS:  Current Outpatient Prescriptions  Medication Sig Dispense Refill  . Ascorbic Acid (VITAMIN C) 1000 MG tablet Take 1,000 mg by mouth daily.    . cholecalciferol (VITAMIN D) 1000 units tablet Take 2,000 Units by mouth daily.    . predniSONE (DELTASONE) 10 MG tablet Take 1 tablet (10 mg total) by mouth daily. 10 tablet 0  . ALPRAZOLAM PO Take 2 tablets by mouth at bedtime as needed (sleep). Reported on 11/20/2015    . ibuprofen (ADVIL,MOTRIN) 200 MG tablet Take 600-800 mg by mouth 2 (two) times daily as needed for moderate pain. Reported on 11/20/2015    . naproxen sodium (ANAPROX) 220 MG tablet Take 440 mg by mouth daily as needed (pain). Reported on  11/20/2015    . ondansetron (ZOFRAN-ODT) 8 MG disintegrating tablet Take 8 mg by mouth daily as needed for nausea. Reported on 11/20/2015  3   No current facility-administered medications for this visit.     REVIEW OF SYSTEMS:   Constitutional: Denies fevers, chills or  night sweats Eyes: Denies blurriness of vision Ears, nose, mouth, throat, and face: Denies mucositis or sore throat Respiratory: Denies cough, dyspnea or wheezes Cardiovascular: Denies palpitation, chest discomfort or lower extremity swelling Gastrointestinal:  Denies nausea, heartburn or change in bowel habits Skin: Denies abnormal skin rashes Lymphatics: Denies new lymphadenopathy or easy bruising Neurological:Denies numbness, tingling or new weaknesses Behavioral/Psych: Mood is stable, no new changes  All other systems were reviewed with the patient and are negative.  PHYSICAL EXAMINATION: ECOG PERFORMANCE STATUS: 0 - Asymptomatic  Filed Vitals:   11/20/15 1012  BP: 133/66  Pulse: 78  Temp: 97.8 F (36.6 C)  Resp: 17   Filed Weights   11/20/15 1012  Weight: 160 lb 9.6 oz (72.848 kg)    GENERAL:alert, no distress and comfortable SKIN: skin color, texture, turgor are normal, no rashes or significant lesions EYES: normal, Conjunctiva are pink and non-injected, sclera clear Musculoskeletal:no cyanosis of digits and no clubbing  NEURO: alert & oriented x 3 with fluent speech, no focal motor/sensory deficits  LABORATORY DATA:  I have reviewed the data as listed No results found for this or any previous visit (from the past 48 hour(s)).  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[place]) I am still waiting for final recommendation from [facility] The patient apparently had multiple blood tests and PET CT scan done at [facility] which excluded lymphoma The final conclusion was he had inflammatory arthritis and appears to be responding well to prednisone The patient requested second opinion to follow with another rheumatologist here long-term In the meantime, he will continue on a tapered course of prednisone per  recommendation from [facility] From the hematology standpoint, specifically with leukocytosis, anemia, thrombocytosis and lymphadenopathy, all these signs and symptoms are considered reactive secondary to inflammatory arthritis and further investigation or treatment are not necessary I will discharge the patient from the clinic and I will summarize his medical history once I receive further information from the [facility] I reinforced the importance of vitamin D and calcium supplement   All questions were answered. The patient knows to call the clinic with any problems, questions or concerns. No barriers to learning was detected.  I spent 15 minutes counseling the patient face to face. The total time spent in the appointment was 20 minutes and more than 50% was on counseling.     Lamoyne Hessel, MD 2/20/201710:38 AM

## 2015-11-20 NOTE — Assessment & Plan Note (Addendum)
I am still waiting for final recommendation from [facility] The patient apparently had multiple blood tests and PET CT scan done at [facility] which excluded lymphoma The final conclusion was he had inflammatory arthritis and appears to be responding well to prednisone The patient requested second opinion to follow with another rheumatologist here long-term In the meantime, he will continue on a tapered course of prednisone per recommendation from [facility] From the hematology standpoint, specifically with leukocytosis, anemia, thrombocytosis and lymphadenopathy, all these signs and symptoms are considered reactive secondary to inflammatory arthritis and further investigation or treatment are not necessary I will discharge the patient from the clinic and I will summarize his medical history once I receive further information from the [facility] I reinforced the importance of vitamin D and calcium supplement

## 2015-11-23 ENCOUNTER — Telehealth: Payer: Self-pay | Admitting: *Deleted

## 2015-11-23 NOTE — Telephone Encounter (Signed)
Mother states she called Dr. Arlean Hopping office and they have not received referral yet.   Informed her Nurse will f/u on this tomorrow and call her back.  She verbalized understanding.

## 2015-11-24 ENCOUNTER — Telehealth: Payer: Self-pay | Admitting: *Deleted

## 2015-11-24 NOTE — Telephone Encounter (Signed)
Called Dr Arlean Hopping office to follow up on referral. Dr Alvy Bimler office notes and Dr Trudie Reed notes were both faxed on 2/22 as well as referral called to office.  They did confirm receipt of referral- will call patient

## 2015-12-07 ENCOUNTER — Encounter: Payer: Self-pay | Admitting: Hematology and Oncology

## 2015-12-15 ENCOUNTER — Encounter: Payer: Self-pay | Admitting: Osteopathic Medicine

## 2015-12-15 DIAGNOSIS — M064 Inflammatory polyarthropathy: Secondary | ICD-10-CM

## 2015-12-15 HISTORY — DX: Inflammatory polyarthropathy: M06.4

## 2015-12-28 ENCOUNTER — Other Ambulatory Visit: Payer: Self-pay | Admitting: *Deleted

## 2015-12-28 ENCOUNTER — Telehealth: Payer: Self-pay | Admitting: *Deleted

## 2015-12-28 DIAGNOSIS — R634 Abnormal weight loss: Secondary | ICD-10-CM

## 2015-12-28 NOTE — Telephone Encounter (Signed)
Mother left message requesting a referral to a nutritionist in the [facility] system to assist Seth Black with healthy weight gain.

## 2015-12-28 NOTE — Telephone Encounter (Signed)
I am not sure which department for referral. Please send referral as soon as you find out

## 2016-03-29 ENCOUNTER — Telehealth: Payer: Self-pay

## 2016-03-29 NOTE — Telephone Encounter (Signed)
I did see the report from the ER, I reviewed the labs. His white cell count was elevated, this is a nonspecific marker of infection/inflammation. Given his other symptoms, and the fact that he is on Humira, there is always a concern for possible infection since Humira suppresses the immune system. I don't think any repeat labs would be too helpful at this time other than to keep an eye on the white cell count, or would consider blood cultures to rule out infection but if he is concerned he should probably be evaluated in the ER or urgent care, or what may be more helpful is for him to contact his rheumatologist. I'm out of the office this afternoon but he could certainly come to urgent care or return to the ER if symptoms are severe.

## 2016-03-29 NOTE — Telephone Encounter (Signed)
Left a message on mom's cell.

## 2016-04-03 ENCOUNTER — Telehealth: Payer: Self-pay

## 2016-04-03 NOTE — Telephone Encounter (Signed)
Error. Rhonda Cunningham,CMA  

## 2016-04-03 NOTE — Telephone Encounter (Signed)
Spoke to patient mother gave her results as noted below. Rhonda Cunningham,CMA

## 2016-04-03 NOTE — Telephone Encounter (Signed)
Patient mother called stated that patient had a series of blood work done on 03/27/2016@ [facility],  and he was told by the physician there to stop taking his [place] until they knew if he had an infection or not.  She said that ER would sends records and labs to PCP. She wants to know what his results are so that he can continue back on his medication. Please advise. Seline Enzor,CMA

## 2016-04-03 NOTE — Telephone Encounter (Signed)
Called the mother back @ [phone]  Was unable to speak to her so I left a messaging advising to call me back regarding note below. Rhonda Cunningham,CMA

## 2016-04-03 NOTE — Telephone Encounter (Signed)
I reviewed the labs, his white blood cells were elevated, this is a nonspecific marker for infection/inflammation, without further exam or discussion I couldn't base a decision on this alone. I don't see that any blood cultures were done. His urine culture was negative. That's about all I could tell her at this point. She will need to come in for a visit at least to recheck the blood count and confirm that his white cells are coming down. Otherwise, can ask his rheumatologist about their recommendations regarding restarting Humira as they are the primary prescriber for this medicine. Not comfortable one way or another telling him what to do about this medicine over the phone, would ask that he come in either see me or to his rheumatologist to recheck blood work, allow for physical exam, and have a discussion about it

## 2016-05-23 ENCOUNTER — Encounter: Payer: Self-pay | Admitting: Osteopathic Medicine

## 2016-05-23 ENCOUNTER — Ambulatory Visit (INDEPENDENT_AMBULATORY_CARE_PROVIDER_SITE_OTHER): Payer: 59 | Admitting: Osteopathic Medicine

## 2016-05-23 ENCOUNTER — Ambulatory Visit: Payer: 59 | Admitting: Osteopathic Medicine

## 2016-05-23 VITALS — BP 129/75 | HR 101 | Ht 77.0 in | Wt 164.0 lb

## 2016-05-23 DIAGNOSIS — K149 Disease of tongue, unspecified: Secondary | ICD-10-CM

## 2016-05-23 DIAGNOSIS — M064 Inflammatory polyarthropathy: Secondary | ICD-10-CM

## 2016-05-23 MED ORDER — CHLORHEXIDINE GLUCONATE 0.12% ORAL RINSE (MEDLINE KIT): 10.0000 mL | Freq: Two times a day (BID) | OROMUCOSAL | 1 refills | Status: AC

## 2016-05-23 NOTE — Progress Notes (Signed)
HPI: Seth Black is a 20 y.o. male  who presents to [facility] today, 05/23/16,  for chief complaint of:  Chief Complaint  Patient presents with  . Other    tongue problem  . Lymphadenopathy    tender cervical nodes     . Context: on MTX and prednisone, seen by Rheum this morning and they were concerned about thrush or viral issue . Location: tongue and cervical lymph nodes . Quality: tongue plaque, irregular. No mouth pain. Tender cervical LN anterior, no enlargement, . Duration: about a day . Modifying factors: immunosuppression as above on Rx . Assoc signs/symptoms: fever intermittent and chronic, joint pain is stable, GI upset is chronic and stable. No sinus pressure, runny nose, sore throat, cough, SOB, chest pain.      Past medical, surgical, social and family history reviewed: Past Medical History:  Diagnosis Date  . 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 WISDOM TOOTH EXTRACTION     Social History  Substance Use Topics  . Smoking status: Never Smoker  . Smokeless tobacco: Never Used  . Alcohol use No   No family history on file.   Current medication list and allergy/intolerance information reviewed:   Current Outpatient Prescriptions  Medication Sig Dispense Refill  . Ascorbic Acid (VITAMIN C) 1000 MG tablet Take 1,000 mg by mouth daily.    . cholecalciferol (VITAMIN D) 1000 units tablet Take 2,000 Units by mouth daily.    . folic acid (FOLVITE) 1 MG tablet TK 1 T PO D  2  . methotrexate (RHEUMATREX) 2.5 MG tablet TK 5 TS PO ONCE WEEKLY  1  . predniSONE (DELTASONE) 10 MG tablet Take 1 tablet (10 mg total) by mouth daily. 10 tablet 0   No current facility-administered  medications for this visit.    No Known Allergies    Review of Systems:  Constitutional:  (+) intermittent chronic fever, no chills, No recent illness, No unintentional weight changes. (+) chronic significant fatigue.   HEENT: No  headache, no vision change, no hearing change, No sore throat, No  sinus pressure  Cardiac: No  chest pain  Respiratory:  No  shortness of breath. No  Cough  Gastrointestinal: No  abdominal pain, (+) chronic nausea, No  vomiting,  No  blood in stool, No  diarrhea, No  constipation   Musculoskeletal: No new myalgia/arthralgia  Skin: No  Rash, No other wounds/concerning lesions  Exam:  BP 129/75   Pulse (!) 101   Ht 6' 5"  (1.956 m)   Wt 164 lb (74.4 kg)   BMI 19.45 kg/m   Constitutional: VS see above. General Appearance: alert, well-developed, well-nourished, NAD  Eyes: Normal lids and conjunctive, non-icteric sclera  Ears, Nose, Mouth, Throat: MMM, Normal external inspection ears/nares/mouth/lips/gums. TM normal bilaterally. Pharynx/tonsils no erythema, no exudate. Nasal mucosa normal. Tongue plaque but irregular borders, yellowish not really c/w thrush appearance   Neck: No masses, trachea midline. No thyroid enlargement. No tenderness/mass appreciated. No lymphadenopathy  Respiratory: Normal respiratory effort. no wheeze, no rhonchi, no rales  Cardiovascular: S1/S2 normal, no murmur, no rub/gallop auscultated. RRR.  Psychiatric: Normal judgment/insight. Normal mood and affect. Oriented x3.      ASSESSMENT/PLAN:   Unspecified leukoplakia, doesn't appear c/w thrush, MTX plus prednisone could certainly  put him at risk for infection like this. Mouth nonpainful. Trial brushing tongue and flossing, chlorhexidine, consider magic mouthwash if these measures aren't helping   Mom states he had CBC done earlier at rheumatologist today. No symptoms classic for viral infection but due to immunosuppression I'm not sure whether his medicines would affect  a typical URI presentation, plus his chronic intermittent fevers muddy the picture as well. I don't find any lymphadenopathy on exam. 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 Be sure to brush teeth and tongue twice daily. Follow up with Rheumatology as directed.     Visit summary with medication list and pertinent instructions was printed for patient to review. All questions at time of visit were answered - patient instructed to contact office with any additional concerns. ER/RTC precautions were reviewed with the patient. Follow-up plan: Return if symptoms worsen or fail to improve.

## 2016-05-23 NOTE — Patient Instructions (Signed)
Let me know if the mouth rinse isn't helping after a few days and we will change to alternate medication. Be sure to brush teeth and tongue twice daily. Follow up with Rheumatology as directed.

## 2016-08-29 ENCOUNTER — Other Ambulatory Visit: Payer: Self-pay | Admitting: Hematology and Oncology

## 2016-08-29 ENCOUNTER — Telehealth: Payer: Self-pay | Admitting: *Deleted

## 2016-08-29 NOTE — Telephone Encounter (Signed)
LVM for pt's mother informing her of Dr. Calton Dach reply.  Encouraged her to call [facility] as suggested.  Asked her to call nurse back if any other questions or concerns.

## 2016-08-29 NOTE — Telephone Encounter (Signed)
Mother states pt continues to have intermittent fevers, generalized pain and inflammation, and nausea. He has not lost any more weight but hasn't gained any either.  He had EGD and Colonoscopy which were negative.  He had labs done by rheumatologist at [facility] in October.  She asks if Dr. Alvy Bimler can review his labs and give her opinion.  Mother states concern, frustration because they haven't been able to give them a definitive diagnosis and her son's condition continues to decline.  He is unable to work now due to the pain.  She asks if they need to make appt to see Dr. Alvy Bimler again?

## 2016-08-29 NOTE — Telephone Encounter (Signed)
The patient was seen by the best physicians at [facility]. Dr. Gerarda Fraction is one of the best rheumatologist in the country. In his last notes, he recommended treatment with medications such as Humira. I am not familiar with these drugs as they are typically prescribed by rheumatologists  In my experience, the [facility] doctors are very accessible with phone calls. I would suggest his mother to call [phone] to get connected to talk to his physicians at [facility] for recommendations and directions.

## 2017-03-01 IMAGING — CR DG CHEST 2V
2 series · 2 of 2 positions shown · non-contrast
Comparison: 08/16/2015

CLINICAL DATA: Persistent fever despite antibiotic therapy for
urinary tract infection.

EXAM:
CHEST  2 VIEW

[w chest pa]
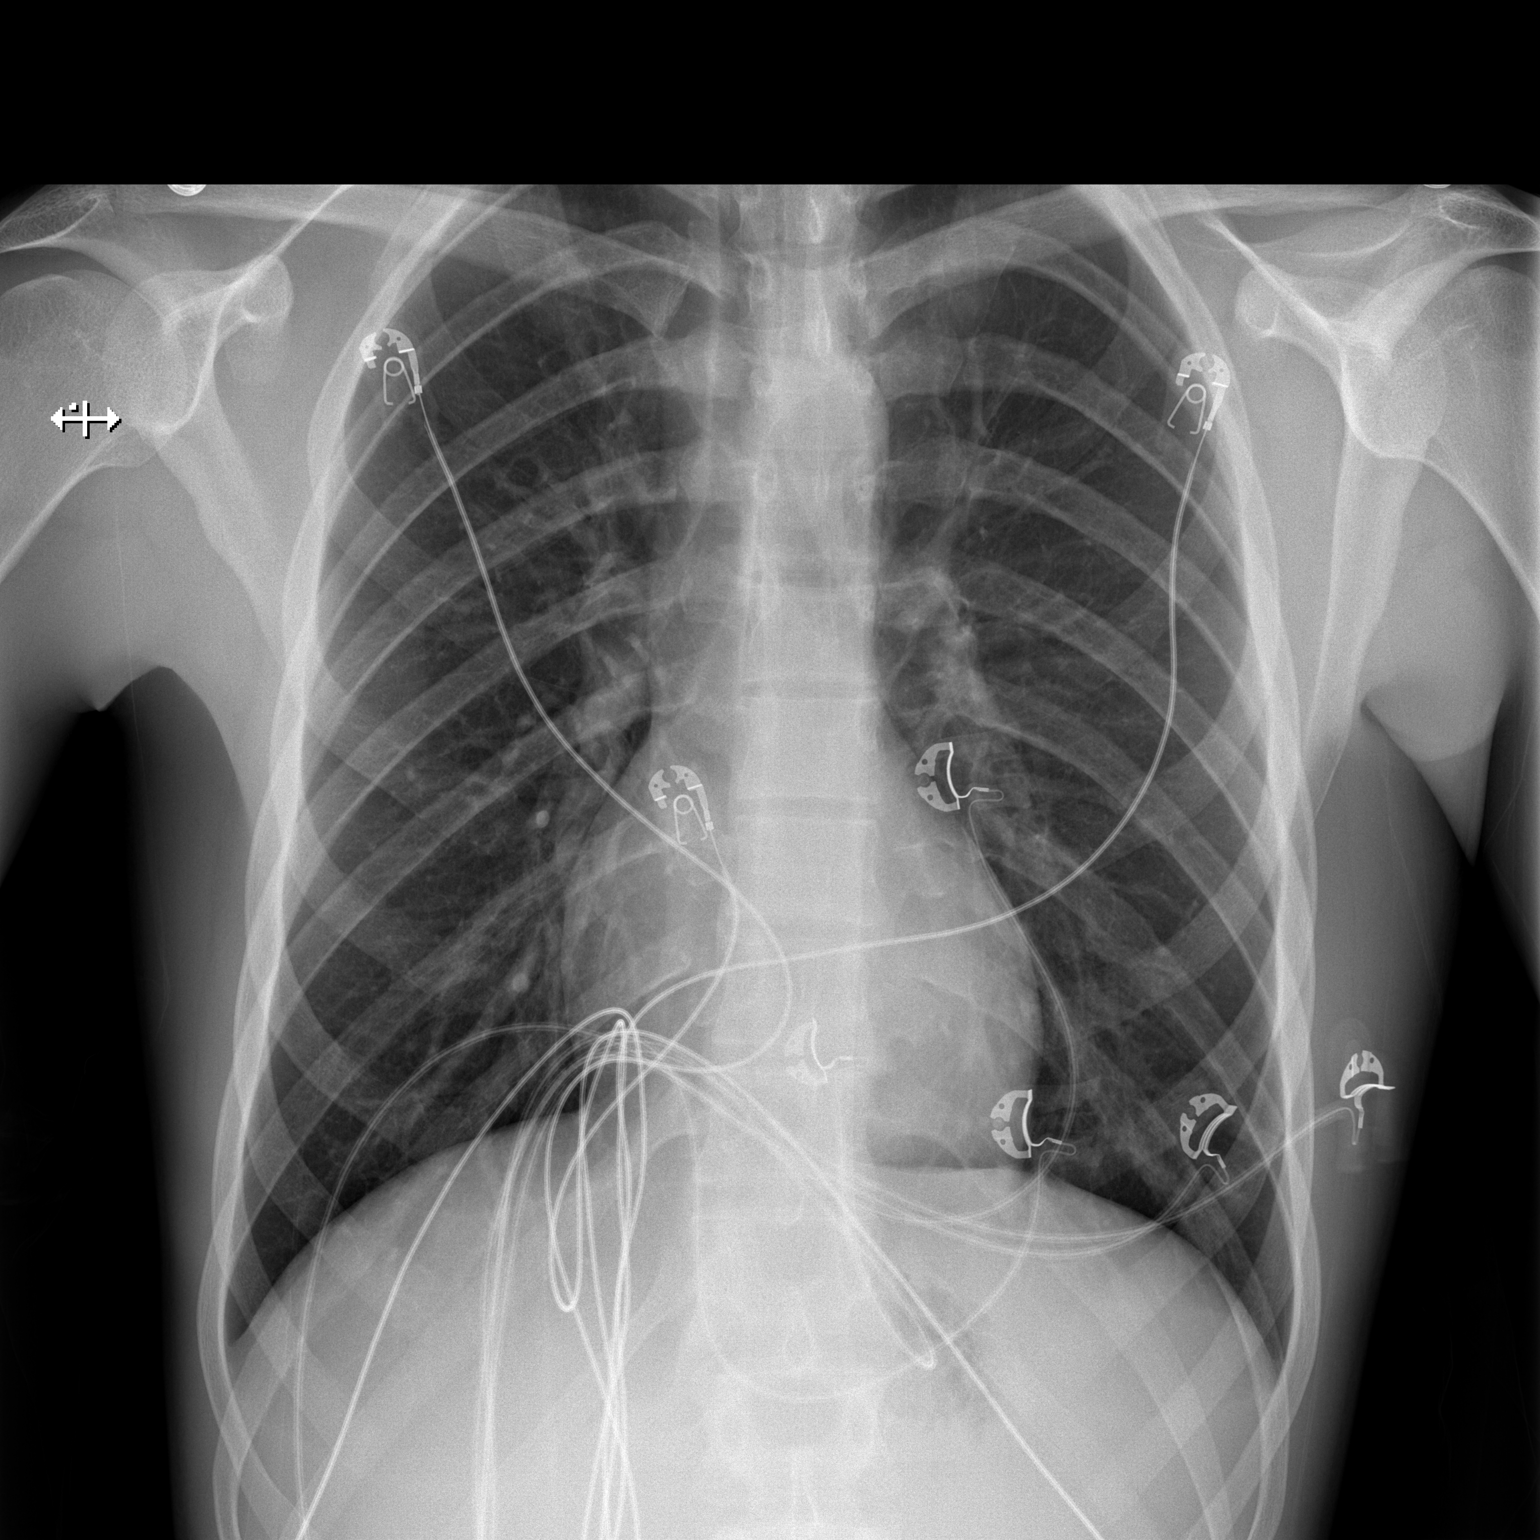

[w chest lat]
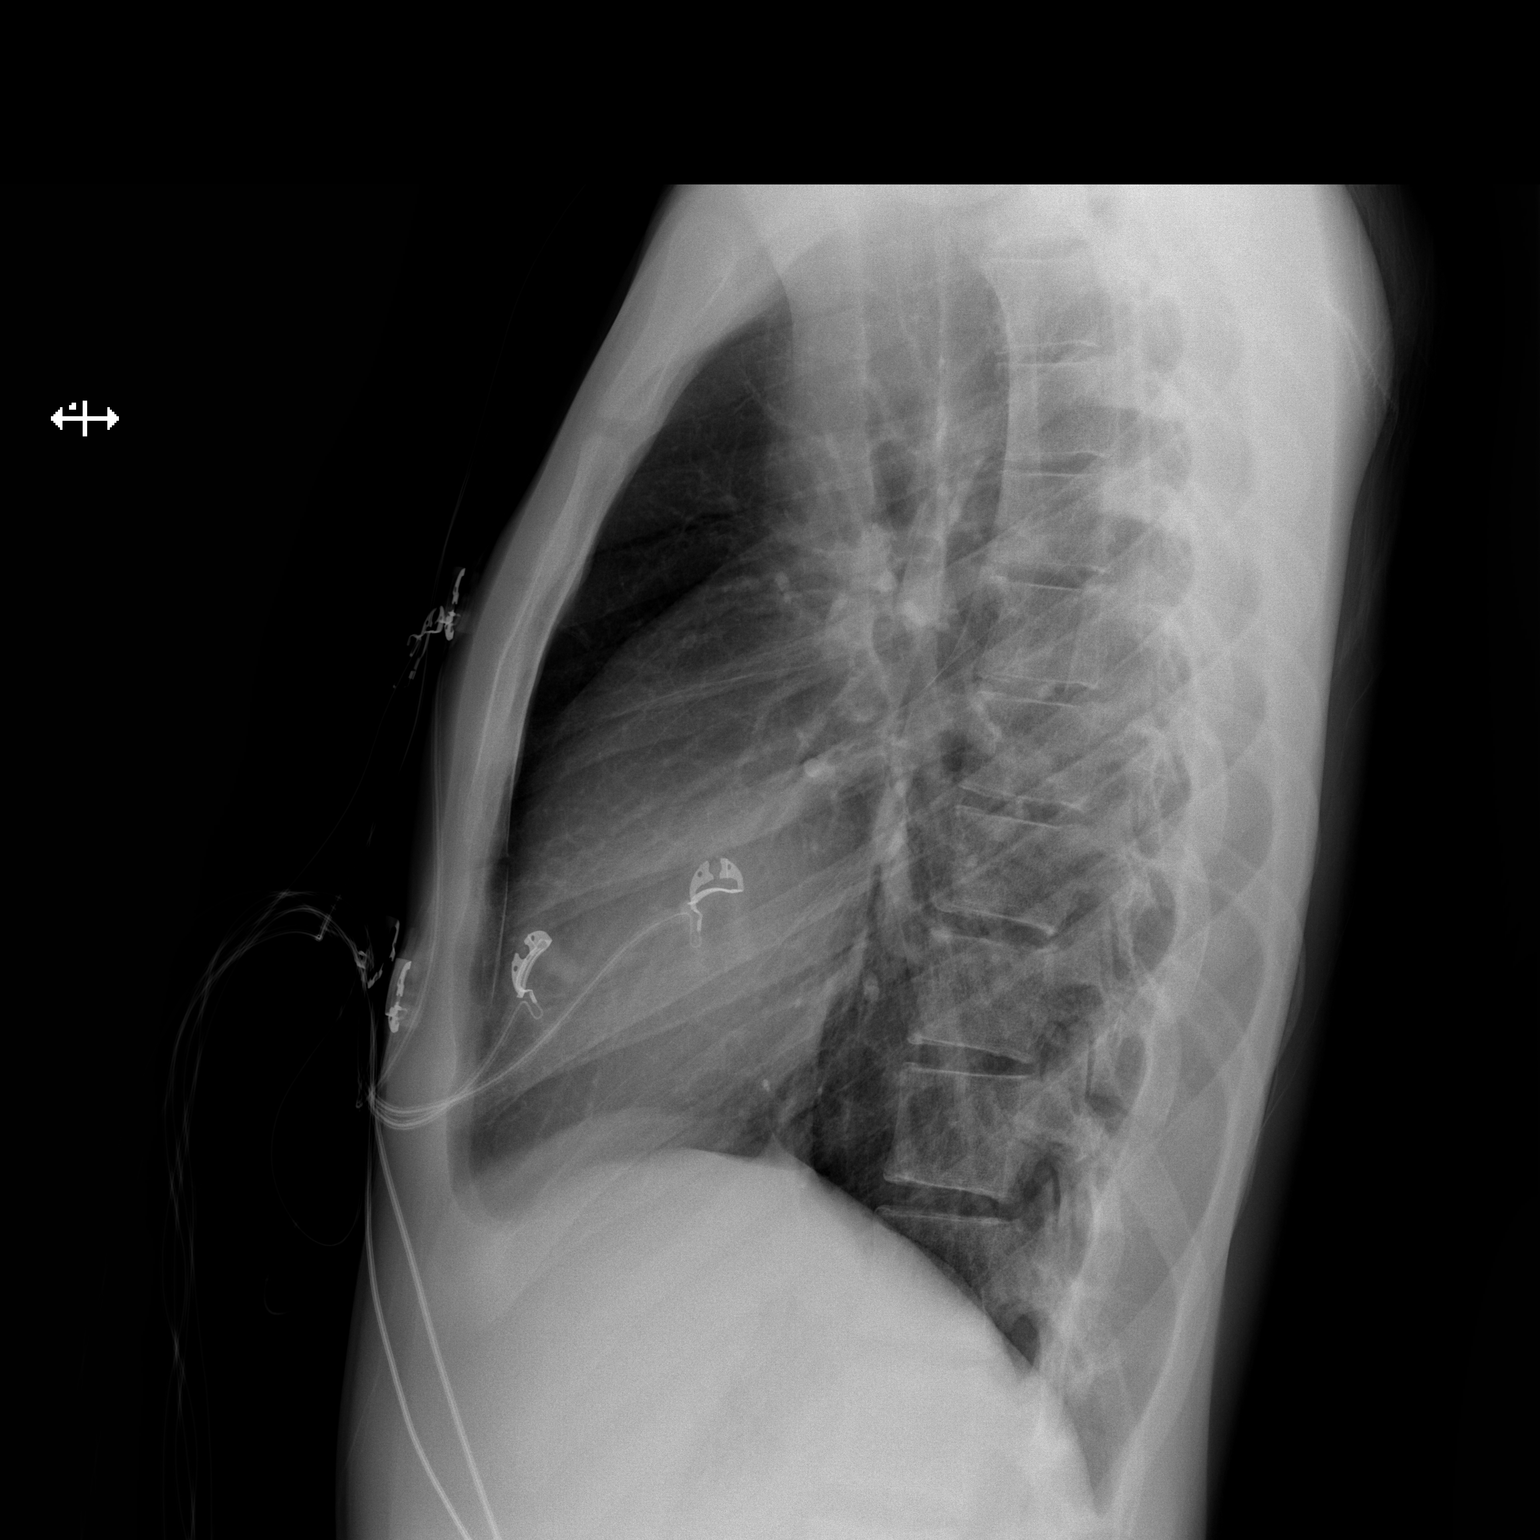

[2 of 2 positions shown; findings below may reference images not displayed]

FINDINGS: The heart size and mediastinal contours are within normal limits.
Both lungs are clear. The visualized skeletal structures are
unremarkable.
IMPRESSION: No active cardiopulmonary disease.

## 2018-10-27 DIAGNOSIS — Z0289 Encounter for other administrative examinations: Secondary | ICD-10-CM

## 2018-10-27 NOTE — Progress Notes (Deleted)
[facility]    Provider:  Dr Jaynee Eagles Referring Provider: Merrilee Seashore MD Primary Care Provider:  Merrilee Seashore MD  CC:  ***  HPI:  Seth Black is a 23 y.o. male here as requested by provider Emeterio Reeve, DO for episodes of Deja Vue.  He has a past medical history of degenerative arthritis of the hip, inflammatory arthritis, lymphadenopathy of the head and neck region, urinary tract infectious disease, reactive thrombocytosis, unintentional weight loss, abnormal ANCA test, positive ANA.  He is here for episodes of dj vu rule out seizures.  Reviewed notes, labs and imaging from outside physicians, which showed ***  Review Dr. 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  Symptoms have been present for approximately 3 years.  MRI of the brain was ordered and completed per notes.  He has been having these spells for 3 years, he has been seen by the [facility] for another reason, he states that they did not evaluate this condition, and he did not mention it to them at that time.  They will occur on and off.  Over the past 6 months he is had no symptoms however over the past 1 month he has had 25 episodes of these spells and 4 episodes the day prior to last exam.  Spells consist of a sensation of dj vu that he is done the same thing or experience the same things in the recent past.  Triggered by various smells, visual triggers as well as thoughts of things from the past.  Once he develops the dj vu he also develops intensive diaphoresis but feels hot he also develops nausea, occasionally he vomits.  The spells last for about 2 minutes and then resolved.  He denies any associated loss of consciousness.  Reviewed labs collection date July 30, 2018 which includes normal CBC and normal CMP with BUN 10 and creatinine 0.97.  Hemoglobin A1c  5.1, TSH 0.813.  Examination was normal.  Review of Systems: Patient complains of symptoms per HPI as well as the following symptoms ***. Pertinent negatives and positives per HPI. All others negative.   Social History   Socioeconomic History  . Marital status: Single    Spouse name: Not on file  . Number of children: Not on file  . Years of education: Not on file  . Highest education level: Not on file  Occupational History  . Not on file  Social Needs  . Financial resource strain: Not on file  . Food insecurity:    Worry: Not on file    Inability: Not on file  . Transportation needs:    Medical: Not on file    Non-medical: Not on file  Tobacco Use  . Smoking status: Never Smoker  . Smokeless tobacco: Never Used  Substance and Sexual Activity  . Alcohol use: No  . Drug use: No  . Sexual activity: Not on file  Lifestyle  . Physical activity:    Days per week: Not on file    Minutes per session: Not on file  . Stress: Not on file  Relationships  . Social connections:    Talks on phone: Not on file    Gets together: Not on file    Attends religious service: Not on file    Active member of club or organization: Not on file    Attends meetings of clubs or organizations:  Not on file    Relationship status: Not on file  . Intimate partner violence:    Fear of current or ex partner: Not on file    Emotionally abused: Not on file    Physically abused: Not on file    Forced sexual activity: Not on file  Other Topics Concern  . Not on file  Social History Narrative  . Not on file    No family history on file.  Past Medical History:  Diagnosis Date  . 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 Undifferentiated  inflammatory polyarthritis ([place]) 12/15/2015  . 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 Lymphadenopathy of head and neck region 10/30/2015  . Degenerative arthritis of hip 10/19/2015  . Vitamin D deficiency 10/06/2015  . Abnormal ANCA test 08/31/2015  . Sterile pyuria 08/23/2015  . Abnormal urinalysis 08/23/2015  . Positive ANA (antinuclear antibody) 08/23/2015  . Reactive thrombocytosis 08/23/2015  . Unintentional weight loss 08/18/2015  . AP (abdominal pain) 08/18/2015  . Recurrent fever 08/18/2015  . Urinary tract infectious disease 08/18/2015  . Anemia of chronic illness 08/18/2015  . Joint pain 08/18/2015  . Leukocytosis 08/18/2015  . ALLERGIC RHINITIS 03/23/2009    Past Surgical History:  Procedure Laterality Date  . WISDOM TOOTH EXTRACTION      Current Outpatient Medications  Medication Sig Dispense Refill  . Ascorbic Acid (VITAMIN C) 1000 MG tablet Take 1,000 mg by mouth daily.    . chlorhexidine gluconate, SAGE KIT, (PERIDEX) 0.12 % solution Use as directed 10 mLs in the mouth or throat 2 (two) times daily. 473 mL 1  . cholecalciferol (VITAMIN D) 1000 units tablet Take 2,000 Units by mouth daily.    . folic acid (FOLVITE) 1 MG tablet TK 1 T PO D  2  . methotrexate (RHEUMATREX) 2.5 MG tablet TK 5 TS PO ONCE WEEKLY  1  . predniSONE (DELTASONE) 10 MG tablet Take 1 tablet (10 mg total) by mouth daily. 10 tablet 0   No current facility-administered medications for this visit.     Allergies as of 10/28/2018  . (No Known Allergies)    Vitals: There were no vitals taken for this visit. Last Weight:  Wt Readings from Last 1 Encounters:  05/23/16 164 lb (74.4 kg)   Last Height:   Ht Readings from Last 1 Encounters:  05/23/16 _0  (1.956 m)     Physical exam: Exam: Gen: NAD, conversant, well nourised, obese, well groomed                     CV: RRR, no MRG. No Carotid Bruits. No peripheral edema, warm, nontender Eyes: Conjunctivae clear without exudates or  hemorrhage  Neuro: Detailed Neurologic Exam  Speech:    Speech is normal; fluent and spontaneous with normal comprehension.  Cognition:    The patient is oriented to person, place, and time;     recent and remote memory intact;     language fluent;     normal attention, concentration,     fund of knowledge Cranial Nerves:    The pupils are equal, round, and reactive to light. The fundi are normal and spontaneous venous pulsations are present. Visual fields are full to finger confrontation. Extraocular movements are intact. Trigeminal sensation is intact  and the muscles of mastication are normal. The face is symmetric. The palate elevates in the midline. Hearing intact. Voice is normal. Shoulder shrug is normal. The tongue has normal motion without fasciculations.   Coordination:    Normal finger to nose and heel to shin. Normal rapid alternating movements.   Gait:    Heel-toe and tandem gait are normal.   Motor Observation:    No asymmetry, no atrophy, and no involuntary movements noted. Tone:    Normal muscle tone.    Posture:    Posture is normal. normal erect    Strength:    Strength is V/V in the upper and lower limbs.      Sensation: intact to LT     Reflex Exam:  DTR's:    Deep tendon reflexes in the upper and lower extremities are normal bilaterally.   Toes:    The toes are downgoing bilaterally.   Clonus:    Clonus is absent.    Assessment/Plan:    No orders of the defined types were placed in this encounter.  No orders of the defined types were placed in this encounter.   Cc: Emeterio Reeve, DO,  Emeterio Reeve, DO  [place], [facility] [address] [place], [place] [address]  Phone [phone] Fax [phone]

## 2018-10-28 ENCOUNTER — Telehealth: Payer: Self-pay | Admitting: *Deleted

## 2018-10-28 ENCOUNTER — Encounter: Payer: Self-pay | Admitting: *Deleted

## 2018-10-28 ENCOUNTER — Ambulatory Visit: Payer: Managed Care, Other (non HMO) | Admitting: Neurology

## 2018-10-28 NOTE — Telephone Encounter (Signed)
Pt no showed his new pt appt today.

## 2018-11-25 ENCOUNTER — Ambulatory Visit: Payer: Managed Care, Other (non HMO) | Admitting: Diagnostic Neuroimaging

## 2019-09-06 ENCOUNTER — Emergency Department (HOSPITAL_BASED_OUTPATIENT_CLINIC_OR_DEPARTMENT_OTHER)
Admission: EM | Admit: 2019-09-06 | Discharge: 2019-09-06 | Disposition: A | Discharge status: Home / Self Care | Payer: Managed Care, Other (non HMO) | Attending: Emergency Medicine | Admitting: Emergency Medicine

## 2019-09-06 ENCOUNTER — Encounter (HOSPITAL_BASED_OUTPATIENT_CLINIC_OR_DEPARTMENT_OTHER): Payer: Self-pay

## 2019-09-06 ENCOUNTER — Other Ambulatory Visit: Payer: Self-pay

## 2019-09-06 ENCOUNTER — Emergency Department (HOSPITAL_BASED_OUTPATIENT_CLINIC_OR_DEPARTMENT_OTHER): Payer: Managed Care, Other (non HMO)

## 2019-09-06 DIAGNOSIS — Z79899 Other long term (current) drug therapy: Secondary | ICD-10-CM | POA: Insufficient documentation

## 2019-09-06 DIAGNOSIS — R0789 Other chest pain: Secondary | ICD-10-CM | POA: Insufficient documentation

## 2019-09-06 DIAGNOSIS — K2900 Acute gastritis without bleeding: Secondary | ICD-10-CM | POA: Diagnosis not present

## 2019-09-06 LAB — CBC WITH DIFFERENTIAL/PLATELET
Abs Immature Granulocytes: 0.02 10*3/uL (ref 0.00–0.07)
Basophils Absolute: 0.1 10*3/uL (ref 0.0–0.1)
Basophils Relative: 1 %
Eosinophils Absolute: 0.1 10*3/uL (ref 0.0–0.5)
Eosinophils Relative: 1 %
HCT: 44.2 % (ref 39.0–52.0)
Hemoglobin: 14.3 g/dL (ref 13.0–17.0)
Immature Granulocytes: 0 %
Lymphocytes Relative: 21 %
Lymphs Abs: 1.6 10*3/uL (ref 0.7–4.0)
MCH: 29.3 pg (ref 26.0–34.0)
MCHC: 32.4 g/dL (ref 30.0–36.0)
MCV: 90.6 fL (ref 80.0–100.0)
Monocytes Absolute: 0.5 10*3/uL (ref 0.1–1.0)
Monocytes Relative: 6 %
Neutro Abs: 5.5 10*3/uL (ref 1.7–7.7)
Neutrophils Relative %: 71 %
Platelets: 314 10*3/uL (ref 150–400)
RBC: 4.88 MIL/uL (ref 4.22–5.81)
RDW: 12.6 % (ref 11.5–15.5)
WBC: 7.7 10*3/uL (ref 4.0–10.5)
nRBC: 0 % (ref 0.0–0.2)

## 2019-09-06 LAB — BASIC METABOLIC PANEL
Anion gap: 9 (ref 5–15)
BUN: 10 mg/dL (ref 6–20)
CO2: 28 mmol/L (ref 22–32)
Calcium: 9.5 mg/dL (ref 8.9–10.3)
Chloride: 102 mmol/L (ref 98–111)
Creatinine, Ser: 0.91 mg/dL (ref 0.61–1.24)
GFR calc Af Amer: >60 mL/min (ref >60)
GFR calc non Af Amer: >60 mL/min (ref >60)
Glucose, Bld: 87 mg/dL (ref 70–99)
Potassium: 4 mmol/L (ref 3.5–5.1)
Sodium: 139 mmol/L (ref 135–145)

## 2019-09-06 LAB — TROPONIN I (HIGH SENSITIVITY): Troponin I (High Sensitivity): <2 ng/L (ref <18)

## 2019-09-06 MED ORDER — ALUM & MAG HYDROXIDE-SIMETH 200-200-20 MG/5ML PO SUSP
30.0000 mL | Freq: Once | ORAL | Status: AC
  Administered 2019-09-06: 30 mL via ORAL
  Filled 2019-09-06: qty 30

## 2019-09-06 MED ORDER — KETOROLAC TROMETHAMINE 30 MG/ML IJ SOLN
30.0000 mg | Freq: Once | INTRAMUSCULAR | Status: AC
  Administered 2019-09-06: 11:00:00 30 mg via INTRAVENOUS
  Filled 2019-09-06: qty 1

## 2019-09-06 NOTE — Discharge Instructions (Addendum)
Continue your home medications as previously prescribed. Medication such as Pepcid and Maalox may help with your symptoms.  Tylenol and ibuprofen may also help as well. Return to the ED if you start to experience worsening symptoms, worsening chest pain, trouble breathing, lightheadedness or loss of consciousness, leg swelling.

## 2019-09-06 NOTE — ED Provider Notes (Signed)
[facility] EMERGENCY DEPARTMENT Provider Note   CSN: [id number] Arrival date & time: 09/06/19  1019     History   Chief Complaint Chief Complaint  Patient presents with  . Chest Pain    HPI Seth Black is a 23 y.o. male with a past medical history of inflammatory arthritis presenting to the ED with a chief complaint of left-sided chest pain.  For the past 4 hours been having sharp left-sided chest pain worse with twisting and movements.  He is unsure if he has had pain similar to this in the past but does note he has had midsternal chest pain in the past due to "arthritis there."  He has tried 1 dose of Alka-Seltzer with no improvement in his symptoms.  Denies any nausea, vomiting, shortness of breath, fever, leg swelling, history of DVT, PE or MI, recent immobilization, alcohol, tobacco or other drug use.     HPI  Past Medical History:  Diagnosis Date  . 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 Lymphadenopathy of head and neck region 10/30/2015  . Degenerative arthritis of hip 10/19/2015  . Vitamin D deficiency 10/06/2015  . Abnormal ANCA test 08/31/2015  . Sterile pyuria 08/23/2015  . Abnormal urinalysis 08/23/2015  . Positive ANA (antinuclear antibody) 08/23/2015  . Reactive thrombocytosis 08/23/2015  . Unintentional weight loss 08/18/2015  . AP (abdominal pain) 08/18/2015  . Recurrent fever 08/18/2015  . Urinary tract infectious disease 08/18/2015  . Anemia of chronic illness 08/18/2015  . Joint pain 08/18/2015  . Leukocytosis 08/18/2015  . ALLERGIC RHINITIS 03/23/2009    Past Surgical History:   Procedure Laterality Date  . COLONOSCOPY    . ENDOSCOPY    . WISDOM TOOTH EXTRACTION          Home Medications    Prior to Admission medications   Medication Sig Start Date End Date Taking? Authorizing Provider  Ascorbic Acid (VITAMIN C) 1000 MG tablet Take 1,000 mg by mouth daily.    [provider]  chlorhexidine gluconate, SAGE KIT, (PERIDEX) 0.12 % solution Use as directed 10 mLs in the mouth or throat 2 (two) times daily. 05/23/16   Emeterio Reeve, DO  cholecalciferol (VITAMIN D) 1000 units tablet Take 2,000 Units by mouth daily.    [provider]  folic acid (FOLVITE) 1 MG tablet TK 1 T PO D 05/13/16   [provider]  methotrexate (RHEUMATREX) 2.5 MG tablet TK 5 TS PO ONCE WEEKLY 05/13/16   [provider]  predniSONE (DELTASONE) 10 MG tablet Take 1 tablet (10 mg total) by mouth daily. 10/28/15   Lajean Saver, MD    Family History History reviewed. No pertinent family history.  Social History Social History   Tobacco Use  . Smoking status: Never Smoker  . Smokeless tobacco: Never Used  Substance Use Topics  . Alcohol use: Yes    Comment: rarely, only at social events  . Drug use: No     Allergies   Patient has no known allergies.   Review of Systems Review of Systems  Constitutional: Negative for appetite change, chills and fever.  HENT: Negative for ear pain, rhinorrhea, sneezing and sore throat.   Eyes: Negative for  photophobia and visual disturbance.  Respiratory: Negative for cough, chest tightness, shortness of breath and wheezing.   Cardiovascular: Positive for chest pain. Negative for palpitations.  Gastrointestinal: Negative for abdominal pain, blood in stool, constipation, diarrhea, nausea and vomiting.  Genitourinary: Negative for dysuria, hematuria and urgency.  Musculoskeletal: Negative for myalgias.  Skin: Negative for rash.  Neurological: Negative for dizziness, weakness and light-headedness.      Physical Exam Updated Vital Signs BP 96/82 (BP Location: Right Arm)   Pulse 67   Temp 98.1 F (36.7 C) (Oral)   Resp 18   Ht 6' 5" (1.956 m)   Wt 79.4 kg   SpO2 99%   BMI 20.75 kg/m   Physical Exam Vitals signs and nursing note reviewed.  Constitutional:      General: He is not in acute distress.    Appearance: He is well-developed.  HENT:     Head: Normocephalic and atraumatic.     Nose: Nose normal.  Eyes:     General: No scleral icterus.       Left eye: No discharge.     Conjunctiva/sclera: Conjunctivae normal.  Neck:     Musculoskeletal: Normal range of motion and neck supple.  Cardiovascular:     Rate and Rhythm: Normal rate and regular rhythm.     Heart sounds: Normal heart sounds. No murmur. No friction rub. No gallop.   Pulmonary:     Effort: Pulmonary effort is normal. No respiratory distress.     Breath sounds: Normal breath sounds.  Abdominal:     General: Bowel sounds are normal. There is no distension.     Palpations: Abdomen is soft.     Tenderness: There is no abdominal tenderness. There is no guarding.  Musculoskeletal: Normal range of motion.     Comments: No lower extremity edema, erythema or calf tenderness bilaterally.  Skin:    General: Skin is warm and dry.     Findings: No rash.  Neurological:     Mental Status: He is alert.     Motor: No abnormal muscle tone.     Coordination: Coordination normal.      ED Treatments / Results  Labs (all labs ordered are listed, but only abnormal results are displayed) Labs Reviewed  BASIC METABOLIC PANEL  CBC WITH DIFFERENTIAL/PLATELET  TROPONIN I (HIGH SENSITIVITY)    EKG None  Radiology Dg Chest 2 View  Result Date: 09/06/2019 CLINICAL DATA:  Central chest pain yesterday. EXAM: CHEST - 2 VIEW COMPARISON:  None. FINDINGS: Artifact overlies the chest. Heart size is normal. Mediastinal shadows are normal. The lungs are clear. No bronchial thickening. No infiltrate, mass, effusion or collapse.  Pulmonary vascularity is normal. No bony abnormality. IMPRESSION: Normal chest radiography. Electronically Signed   By: Nelson Chimes M.D.   On: 09/06/2019 11:22    Procedures Procedures (including critical care time)  Medications Ordered in ED Medications  ketorolac (TORADOL) 30 MG/ML injection 30 mg (30 mg Intravenous Given 09/06/19 1102)  alum & mag hydroxide-simeth (MAALOX/MYLANTA) 200-200-20 MG/5ML suspension 30 mL (30 mLs Oral Given 09/06/19 1101)     Initial Impression / Assessment and Plan / ED Course  I have reviewed the triage vital signs and the nursing notes.  Pertinent labs & imaging results that were available during my care of the patient were reviewed by me and considered in my medical decision making (see chart for details).        Seth Black was evaluated in Emergency Department on 09/06/19  for the symptoms described in the history of present illness. He/she was evaluated in the context of the global COVID-19 pandemic, which necessitated consideration that the patient might be at risk for infection with the SARS-CoV-2 virus that causes COVID-19. Institutional protocols and algorithms that pertain to the evaluation of patients at risk for COVID-19 are in a state of rapid change based on information released by regulatory bodies including the CDC and federal and state organizations. These policies and algorithms were followed during the patient's care in the ED.  23 year old male with a past medical history of inflammatory arthritis presenting to the ED with a chief complaint of left-sided chest pain.  No associated shortness of breath, fever, cough or leg swelling.  Patient is overall well-appearing on exam.  No concern for DVT.  Vital signs within normal limits.  EKG shows normal sinus rhythm.  Chest x-ray is unremarkable.  Lab work including CBC, BMP and troponin unremarkable.  Patient is PERC negative, low risk by heart score so I doubt PE, ACS or other emergent cause  of his symptoms.  He has had significant improvement with Toradol and Maalox. Suspect chest wall pain versus gastritis.  We will have him follow-up with PCP and return for worsening symptoms.  Patient is hemodynamically stable, in NAD, and able to ambulate in the ED. Evaluation does not show pathology that would require ongoing emergent intervention or inpatient treatment. I explained the diagnosis to the patient. Pain has been managed and has no complaints prior to discharge. Patient is comfortable with above plan and is stable for discharge at this time. All questions were answered prior to disposition. Strict return precautions for returning to the ED were discussed. Encouraged follow up with PCP.   An After Visit Summary was printed and given to the patient.   Portions of this note were generated with [employer]. Dictation errors may occur despite best attempts at proofreading.   Final Clinical Impressions(s) / ED Diagnoses   Final diagnoses:  Chest wall pain  Acute gastritis without hemorrhage, unspecified gastritis type    ED Discharge Orders    None       Delia Heady, PA-C 09/06/19 1208    Lucrezia Starch, MD 09/07/19 [phone]

## 2019-09-06 NOTE — ED Triage Notes (Signed)
Pt c/o left sided chest pain since yesterday. Denies any fever chills or SOB. Increased pain with twisting.

## 2021-03-05 ENCOUNTER — Emergency Department (HOSPITAL_BASED_OUTPATIENT_CLINIC_OR_DEPARTMENT_OTHER)
Admission: EM | Admit: 2021-03-05 | Discharge: 2021-03-05 | Disposition: A | Discharge status: Home / Self Care | Payer: PRIVATE HEALTH INSURANCE | Attending: Emergency Medicine | Admitting: Emergency Medicine

## 2021-03-05 ENCOUNTER — Ambulatory Visit
Admission: EM | Admit: 2021-03-05 | Discharge: 2021-03-05 | Discharge status: Against Medical Advice | Payer: Managed Care, Other (non HMO)

## 2021-03-05 ENCOUNTER — Encounter (HOSPITAL_BASED_OUTPATIENT_CLINIC_OR_DEPARTMENT_OTHER): Payer: Self-pay | Admitting: Emergency Medicine

## 2021-03-05 ENCOUNTER — Other Ambulatory Visit: Payer: Self-pay

## 2021-03-05 DIAGNOSIS — T782XXA Anaphylactic shock, unspecified, initial encounter: Secondary | ICD-10-CM | POA: Insufficient documentation

## 2021-03-05 DIAGNOSIS — T7840XA Allergy, unspecified, initial encounter: Secondary | ICD-10-CM | POA: Diagnosis present

## 2021-03-05 MED ORDER — DIPHENHYDRAMINE HCL 50 MG/ML IJ SOLN
25.0000 mg | Freq: Once | INTRAMUSCULAR | Status: DC
  Filled 2021-03-05: qty 1

## 2021-03-05 MED ORDER — FAMOTIDINE IN NACL 20-0.9 MG/50ML-% IV SOLN
20.0000 mg | Freq: Once | INTRAVENOUS | Status: AC
  Administered 2021-03-05: 20 mg via INTRAVENOUS
  Filled 2021-03-05: qty 50

## 2021-03-05 MED ORDER — EPINEPHRINE 0.3 MG/0.3ML IJ SOAJ
INTRAMUSCULAR | Status: AC
  Administered 2021-03-05: 0.3 mg via INTRAMUSCULAR
  Filled 2021-03-05: qty 0.3

## 2021-03-05 MED ORDER — EPINEPHRINE 0.3 MG/0.3ML IJ SOAJ
0.3000 mg | Freq: Once | INTRAMUSCULAR | Status: AC
  Filled 2021-03-05: qty 0.3

## 2021-03-05 MED ORDER — METHYLPREDNISOLONE SODIUM SUCC 125 MG IJ SOLR
125.0000 mg | Freq: Once | INTRAMUSCULAR | Status: AC
  Administered 2021-03-05: 125 mg via INTRAVENOUS
  Filled 2021-03-05: qty 2

## 2021-03-05 MED ORDER — EPINEPHRINE 0.3 MG/0.3ML IJ SOAJ: 0.3000 mg | INTRAMUSCULAR | 0 refills | Status: AC | PRN

## 2021-03-05 MED ORDER — PREDNISONE 20 MG PO TABS: 40.0000 mg | ORAL_TABLET | Freq: Every day | ORAL | 0 refills | Status: AC

## 2021-03-05 NOTE — ED Notes (Signed)
Pt with generalized rash and swelling to eyes after taking anxiety meds; pt decided to seek treatment in ER

## 2021-03-05 NOTE — Discharge Instructions (Signed)
Please take steroid as prescribed.  For mild symptoms such as itching, rash, take Benadryl as needed.  For any more severe symptoms such as difficulty breathing, throat swelling use EpiPen as directed.  Follow-up with your primary doctor.  Do not take propanolol in the future.

## 2021-03-05 NOTE — ED Provider Notes (Signed)
[facility] EMERGENCY DEPT Provider Note   CSN: [id number] Arrival date & time: 03/05/21  1632     History Chief Complaint  Patient presents with  . Allergic Reaction    Seth Black is a 25 y.o. male.  Presents ER with concern for allergic reaction.  Patient reports that he took a new medication for anxiety today and about 30 to 45 minutes later started having hives, difficulty breathing.  States that he feels somewhat short of breath, clammy, noted rash on all 4 extremities and across his chest and back.  Has not had anaphylactic reaction before.  States his father does have a history of anaphylaxis.  Denies any history of medical problems.  HPI     Past Medical History:  Diagnosis Date  . 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 Lymphadenopathy of head and neck region 10/30/2015  . Degenerative arthritis of hip 10/19/2015  . Vitamin D deficiency 10/06/2015  . Abnormal ANCA test 08/31/2015  . Sterile pyuria 08/23/2015  . Abnormal urinalysis 08/23/2015  . Positive ANA (antinuclear antibody) 08/23/2015  . Reactive thrombocytosis 08/23/2015  . Unintentional weight loss 08/18/2015  . AP (abdominal pain) 08/18/2015  . Recurrent fever 08/18/2015  . Urinary tract infectious disease 08/18/2015  . Anemia of chronic illness 08/18/2015  . Joint pain 08/18/2015  . Leukocytosis 08/18/2015  . ALLERGIC RHINITIS 03/23/2009    Past Surgical History:  Procedure Laterality Date  . COLONOSCOPY    . ENDOSCOPY    . WISDOM TOOTH EXTRACTION         History reviewed. No pertinent family history.  Social History    Tobacco Use  . Smoking status: Never Smoker  . Smokeless tobacco: Never Used  Substance Use Topics  . Alcohol use: Yes    Comment: rarely, only at social events  . Drug use: No    Home Medications Prior to Admission medications   Medication Sig Start Date End Date Taking? Authorizing Provider  EPINEPHrine 0.3 mg/0.3 mL IJ SOAJ injection Inject 0.3 mg into the muscle as needed for anaphylaxis. 03/05/21  Yes Lucrezia Starch, MD  EPINEPHrine 0.3 mg/0.3 mL IJ SOAJ injection Inject 0.3 mg into the muscle as needed for anaphylaxis. 03/05/21  Yes Lucrezia Starch, MD  predniSONE (DELTASONE) 20 MG tablet Take 2 tablets (40 mg total) by mouth daily for 4 days. 03/05/21 03/09/21 Yes Denvil Canning, Ellwood Dense, MD  Ascorbic Acid (VITAMIN C) 1000 MG tablet Take 1,000 mg by mouth daily.    [provider]  chlorhexidine gluconate, SAGE KIT, (PERIDEX) 0.12 % solution Use as directed 10 mLs in the mouth or throat 2 (two) times daily. 05/23/16   Emeterio Reeve, DO  cholecalciferol (VITAMIN D) 1000 units tablet Take 2,000 Units by mouth daily.    [provider]  folic acid (FOLVITE) 1 MG tablet TK 1 T PO D 05/13/16   [provider]  methotrexate (RHEUMATREX) 2.5 MG tablet TK 5 TS PO ONCE WEEKLY 05/13/16   [provider]  predniSONE (DELTASONE) 10 MG tablet Take 1 tablet (10 mg total) by mouth daily. 10/28/15   Lajean Saver, MD    Allergies    Propranolol hcl  Review of Systems  Review of Systems  Constitutional: Negative for chills, diaphoresis and fever.  HENT: Negative for ear pain and sore throat.   Eyes: Negative for pain and visual disturbance.  Respiratory: Positive for shortness of breath. Negative for cough.   Cardiovascular: Negative for chest pain and palpitations.  Gastrointestinal: Negative for abdominal pain and vomiting.  Genitourinary: Negative for dysuria and hematuria.  Musculoskeletal: Negative for arthralgias and back pain.  Skin: Positive for  rash. Negative for color change.  Neurological: Negative for seizures and syncope.  All other systems reviewed and are negative.   Physical Exam Updated Vital Signs BP (!) 99/50   Pulse 79   Temp 98 F (36.7 C) (Oral)   Resp (!) 21   Ht 6' 5"  (1.956 m)   Wt 74.8 kg   SpO2 98%   BMI 19.57 kg/m   Physical Exam Vitals and nursing note reviewed.  Constitutional:      Appearance: He is well-developed.     Comments: Appears to be in mild distress  HENT:     Head: Normocephalic and atraumatic.  Eyes:     Conjunctiva/sclera: Conjunctivae normal.  Cardiovascular:     Rate and Rhythm: Normal rate and regular rhythm.     Heart sounds: No murmur heard.   Pulmonary:     Comments: No wheezing, there is mild tachypnea Abdominal:     Palpations: Abdomen is soft.     Tenderness: There is no abdominal tenderness.  Musculoskeletal:     Cervical back: Neck supple.  Skin:    Comments: Diffuse urticarial rash over all 4 extremities, chest and back, blanchable     ED Results / Procedures / Treatments   Labs (all labs ordered are listed, but only abnormal results are displayed) Labs Reviewed - No data to display  EKG None  Radiology No results found.  Procedures .Critical Care Performed by: Lucrezia Starch, MD Authorized by: Lucrezia Starch, MD   Critical care provider statement:    Critical care time (minutes):  38   Critical care was time spent personally by me on the following activities:  Discussions with consultants, evaluation of patient's response to treatment, examination of patient, ordering and performing treatments and interventions, ordering and review of laboratory studies, ordering and review of radiographic studies, pulse oximetry, re-evaluation of patient's condition, obtaining history from patient or surrogate and review of old charts     Medications Ordered in ED Medications  diphenhydrAMINE (BENADRYL) injection 25 mg (25 mg Intravenous Not Given  03/05/21 1649)  EPINEPHrine (EPI-PEN) injection 0.3 mg (0.3 mg Intramuscular Given 03/05/21 1650)  methylPREDNISolone sodium succinate (SOLU-MEDROL) 125 mg/2 mL injection 125 mg (125 mg Intravenous Given 03/05/21 1652)  famotidine (PEPCID) IVPB 20 mg premix (0 mg Intravenous Stopped 03/05/21 1723)    ED Course  I have reviewed the triage vital signs and the nursing notes.  Pertinent labs & imaging results that were available during my care of the patient were reviewed by me and considered in my medical decision making (see chart for details).    MDM Rules/Calculators/A&P                         25 year old male presents to ER with concern for hives, difficulty breathing after starting new medication.  Based on initial assessment, concern for anaphylaxis.  Provided EpiPen, Benadryl, Pepcid, steroids.  All symptoms resolved.  Observed in ER for around 4 hours.  No recurrence of symptoms, okay for discharge and  outpatient management at present.  Recommend recheck with primary doctor, discontinue propranolol, provided EpiPen Rx, course of steroids. Dc home with mother.    After the discussed management above, the patient was determined to be safe for discharge.  The patient was in agreement with this plan and all questions regarding their care were answered.  ED return precautions were discussed and the patient will return to the ED with any significant worsening of condition.   Final Clinical Impression(s) / ED Diagnoses Final diagnoses:  Anaphylaxis, initial encounter    Rx / DC Orders ED Discharge Orders         Ordered    predniSONE (DELTASONE) 20 MG tablet  Daily        03/05/21 2004    EPINEPHrine 0.3 mg/0.3 mL IJ SOAJ injection  As needed        03/05/21 2004    EPINEPHrine 0.3 mg/0.3 mL IJ SOAJ injection  As needed        03/05/21 2005           Lucrezia Starch, MD 03/05/21 2008

## 2021-03-05 NOTE — ED Triage Notes (Signed)
Pt reports allergic reaction rash, shortness of breath, anxiety to propanolol x45 minutes. Has no hx of allergic reaction.
